# Patient Record
Sex: Female | Born: 1970 | Race: White | Hispanic: No | Marital: Married | Smoking: Former smoker
Health system: Southern US, Community
[De-identification: ages and names within clinical notes are randomized; demographics above are authoritative.]

## PROBLEM LIST (undated history)

## (undated) DIAGNOSIS — G4700 Insomnia, unspecified: Secondary | ICD-10-CM

## (undated) DIAGNOSIS — J302 Other seasonal allergic rhinitis: Secondary | ICD-10-CM

## (undated) DIAGNOSIS — G44009 Cluster headache syndrome, unspecified, not intractable: Secondary | ICD-10-CM

## (undated) DIAGNOSIS — O139 Gestational [pregnancy-induced] hypertension without significant proteinuria, unspecified trimester: Secondary | ICD-10-CM

## (undated) DIAGNOSIS — G43909 Migraine, unspecified, not intractable, without status migrainosus: Secondary | ICD-10-CM

## (undated) HISTORY — DX: Gestational (pregnancy-induced) hypertension without significant proteinuria, unspecified trimester: O13.9

## (undated) HISTORY — DX: Migraine, unspecified, not intractable, without status migrainosus: G43.909

## (undated) HISTORY — DX: Other seasonal allergic rhinitis: J30.2

## (undated) HISTORY — DX: Cluster headache syndrome, unspecified, not intractable: G44.009

## (undated) HISTORY — PX: TONSILLECTOMY: SHX5217

## (undated) HISTORY — DX: Insomnia, unspecified: G47.00

---

## 2007-02-04 ENCOUNTER — Inpatient Hospital Stay (HOSPITAL_COMMUNITY): Admission: AD | Admit: 2007-02-04 | Discharge: 2007-02-04 | Payer: Self-pay | Admitting: Obstetrics and Gynecology

## 2007-02-07 ENCOUNTER — Inpatient Hospital Stay (HOSPITAL_COMMUNITY): Admission: AD | Admit: 2007-02-07 | Discharge: 2007-02-10 | Payer: Self-pay | Admitting: Obstetrics and Gynecology

## 2008-01-10 ENCOUNTER — Encounter: Payer: Self-pay | Admitting: Family Medicine

## 2008-01-21 ENCOUNTER — Ambulatory Visit: Payer: Self-pay | Admitting: Family Medicine

## 2008-01-21 DIAGNOSIS — M109 Gout, unspecified: Secondary | ICD-10-CM

## 2008-01-26 ENCOUNTER — Ambulatory Visit: Payer: Self-pay | Admitting: Family Medicine

## 2008-01-27 ENCOUNTER — Telehealth: Payer: Self-pay | Admitting: Family Medicine

## 2008-01-28 ENCOUNTER — Encounter: Payer: Self-pay | Admitting: Family Medicine

## 2008-03-13 ENCOUNTER — Ambulatory Visit: Payer: Self-pay | Admitting: Family Medicine

## 2008-03-13 DIAGNOSIS — J309 Allergic rhinitis, unspecified: Secondary | ICD-10-CM | POA: Insufficient documentation

## 2008-05-10 ENCOUNTER — Telehealth: Payer: Self-pay | Admitting: Family Medicine

## 2008-05-12 ENCOUNTER — Ambulatory Visit: Payer: Self-pay | Admitting: Family Medicine

## 2008-05-12 DIAGNOSIS — G4700 Insomnia, unspecified: Secondary | ICD-10-CM

## 2008-06-05 ENCOUNTER — Ambulatory Visit: Payer: Self-pay | Admitting: Family Medicine

## 2008-06-28 ENCOUNTER — Telehealth: Payer: Self-pay | Admitting: *Deleted

## 2008-12-11 ENCOUNTER — Telehealth: Payer: Self-pay | Admitting: Family Medicine

## 2009-01-04 ENCOUNTER — Ambulatory Visit: Payer: Self-pay | Admitting: Family Medicine

## 2009-01-04 DIAGNOSIS — G44009 Cluster headache syndrome, unspecified, not intractable: Secondary | ICD-10-CM | POA: Insufficient documentation

## 2009-01-11 ENCOUNTER — Ambulatory Visit: Payer: Self-pay | Admitting: Family Medicine

## 2009-03-02 ENCOUNTER — Ambulatory Visit: Payer: Self-pay | Admitting: Internal Medicine

## 2009-03-02 DIAGNOSIS — J069 Acute upper respiratory infection, unspecified: Secondary | ICD-10-CM | POA: Insufficient documentation

## 2009-05-30 ENCOUNTER — Telehealth: Payer: Self-pay | Admitting: Family Medicine

## 2009-05-31 ENCOUNTER — Ambulatory Visit: Payer: Self-pay | Admitting: Family Medicine

## 2009-05-31 LAB — CONVERTED CEMR LAB: HDL goal, serum: 40 mg/dL

## 2009-07-11 ENCOUNTER — Telehealth: Payer: Self-pay | Admitting: Family Medicine

## 2009-09-04 ENCOUNTER — Ambulatory Visit: Payer: Self-pay | Admitting: Family Medicine

## 2009-09-04 DIAGNOSIS — J157 Pneumonia due to Mycoplasma pneumoniae: Secondary | ICD-10-CM

## 2010-02-12 NOTE — Progress Notes (Signed)
Summary: sinus infection  Phone Note Call from Patient   Caller: Patient Call For: Roderick Pee MD Summary of Call: Pt. is calling to ask for a Zpack for sinus infection, and cannot come to the office.   [facility] [phone]   [phone] Initial call taken by: Lynann Beaver CMA,  May 30, 2009 9:41 AM  Follow-up for Phone Call        problem+========OV .tomorrow........today she can take plain Claritin, 10 mg b.i.d., afrin nasal spray, followed by saline nasal spray with a netti pot twice daily until she sees me tomorrow Follow-up by: Roderick Pee MD,  May 30, 2009 10:10 AM  Additional Follow-up for Phone Call Additional follow up Details #1::        Pt. notified and appt scheduled. Additional Follow-up by: Lynann Beaver CMA,  May 30, 2009 10:13 AM

## 2010-02-12 NOTE — Assessment & Plan Note (Signed)
Summary: cough/chest congestion/stuffy nose/cjr   Vital Signs:  Patient profile:   40 year old female Weight:      170 pounds Temp:     98.4 degrees F oral BP sitting:   100 / 68  (right arm) Cuff size:   regular  Vitals Entered By: Duard Brady LPN (March 02, 2009 11:16 AM) CC: c.o chest congestion, sob and tightness with exercise, 2 weeks ago cough, congestion Is Patient Diabetic? No   CC:  c.o chest congestion, sob and tightness with exercise, 2 weeks ago cough, and congestion.  History of Present Illness: 40 year old patient who has a two week history of cough and congestion.  She is somewhat improved today, but still has exertional paroxysm of coughing, minimal sputum production, and dyspnea, and a sense of unable to take a deep breath.  She gets a bit short of breath with household activities.  She did run in a competitive race 5 days ago and became quite dyspneic with diminished exercise capacity.  At the present time.  She is still coughing, but cough is largely nonproductive.  There is been no fever or wheezing.  There is no history of asthma  Preventive Screening-Counseling & Management  Alcohol-Tobacco     Smoking Status: quit  Allergies: 1)  Prednisone  Family History: Family History Hypertension  Social History: Smoking Status:  quit  Review of Systems       The patient complains of anorexia and prolonged cough.  The patient denies fever, weight loss, weight gain, vision loss, decreased hearing, hoarseness, chest pain, syncope, dyspnea on exertion, peripheral edema, headaches, hemoptysis, abdominal pain, melena, hematochezia, severe indigestion/heartburn, hematuria, incontinence, genital sores, muscle weakness, suspicious skin lesions, transient blindness, difficulty walking, depression, unusual weight change, abnormal bleeding, enlarged lymph nodes, angioedema, and breast masses.    Physical Exam  General:  Well-developed,well-nourished,in no acute  distress; alert,appropriate and cooperative throughout examination Head:  Normocephalic and atraumatic without obvious abnormalities. No apparent alopecia or balding. Eyes:  No corneal or conjunctival inflammation noted. EOMI. Perrla. Funduscopic exam benign, without hemorrhages, exudates or papilledema. Vision grossly normal. Ears:  External ear exam shows no significant lesions or deformities.  Otoscopic examination reveals clear canals, tympanic membranes are intact bilaterally without bulging, retraction, inflammation or discharge. Hearing is grossly normal bilaterally. Mouth:  Oral mucosa and oropharynx without lesions or exudates.  Teeth in good repair. Neck:  No deformities, masses, or tenderness noted. Lungs:  Normal respiratory effort, chest expands symmetrically. Lungs are clear to auscultation, no crackles or wheezes. O2 saturation at rest 98% O2 saturation after a brisk walk down the hall 99% Heart:  Normal rate and regular rhythm. S1 and S2 normal without gallop, murmur, click, rub or other extra sounds. Abdomen:  Bowel sounds positive,abdomen soft and non-tender without masses, organomegaly or hernias noted.   Impression & Recommendations:  Problem # 1:  URI (ICD-465.9)  Her updated medication list for this problem includes:    Zyrtec Allergy 10 Mg Tabs (Cetirizine hcl) ..... Once daily    Promethazine Hcl 25 Mg Supp (Promethazine hcl) .Marland Kitchen..Marland Kitchen 1 rectally q 8hr. as needed    Hydrocodone-homatropine 5-1.5 Mg/78ml Syrp (Hydrocodone-homatropine) .Marland Kitchen... 1 teaspoon every 6 hours as needed for cough  Her updated medication list for this problem includes:    Zyrtec Allergy 10 Mg Tabs (Cetirizine hcl) ..... Once daily    Promethazine Hcl 25 Mg Supp (Promethazine hcl) .Marland Kitchen..Marland Kitchen 1 rectally q 8hr. as needed    Hydrocodone-homatropine 5-1.5 Mg/42ml Syrp (Hydrocodone-homatropine) .Marland KitchenMarland KitchenMarland KitchenMarland Kitchen 1  teaspoon every 6 hours as needed for cough  Complete Medication List: 1)  Daily Multiple Vitamins Tabs  (Multiple vitamin) .... Take one tab once daily 2)  Zyrtec Allergy 10 Mg Tabs (Cetirizine hcl) .... Once daily 3)  Celexa 40 Mg Tabs (Citalopram hydrobromide) .... Take 1 1/2 tab by mouth at bedtime. 4)  Imitrex 25 Mg Tabs (Sumatriptan succinate) .... Take one on onset of migraine 5)  Promethazine Hcl 25 Mg Supp (Promethazine hcl) .Marland Kitchen.. 1 rectally q 8hr. as needed 6)  Vicodin Es 7.5-750 Mg Tabs (Hydrocodone-acetaminophen) .... Take 1 tablet by mouth four times a day as needed pain 7)  Topamax 100 Mg Tabs (Topiramate) .Marland Kitchen.. 1 tab @ bedtime 8)  Prednisone 20 Mg Tabs (Prednisone) .... One twice daily 9)  Hydrocodone-homatropine 5-1.5 Mg/34ml Syrp (Hydrocodone-homatropine) .Marland Kitchen.. 1 teaspoon every 6 hours as needed for cough  Patient Instructions: 1)  Get plenty of rest, drink lots of clear liquids, and use Tylenol or Ibuprofen for fever and comfort. Return in 7-10 days if you're not better:sooner if you're feeling worse. Prescriptions: HYDROCODONE-HOMATROPINE 5-1.5 MG/5ML SYRP (HYDROCODONE-HOMATROPINE) 1 teaspoon every 6 hours as needed for cough  #6 oz x 0   Entered and Authorized by:   Gordy Savers  MD   Signed by:   Gordy Savers  MD on 03/02/2009   Method used:   Print then Give to Patient   RxID:   [id number] PREDNISONE 20 MG TABS (PREDNISONE) one twice daily  #14 x 0   Entered and Authorized by:   Gordy Savers  MD   Signed by:   Gordy Savers  MD on 03/02/2009   Method used:   Print then Give to Patient   RxID:   [id number]

## 2010-02-12 NOTE — Assessment & Plan Note (Signed)
Summary: virus-fever x 4 days//ccm   Vital Signs:  Patient profile:   41 year old female Height:      68 inches Weight:      172 pounds BMI:     26.25 Temp:     99.0 degrees F oral BP sitting:   110 / 80  (left arm) Cuff size:   regular  Vitals Entered By: Kern Reap CMA (AAMA) (September 04, 2009 10:00 AM) CC: chest cough, fever, body aches   CC:  chest cough, fever, and body aches.  History of Present Illness: Brenda Haynes is a 40 year old, married female, nonsmoker, who comes in today with a 5 day history of fever, chills, sore throat, cough, headache, and diffuse myalgias.  She states she was around her niece two weeks ago, who had a Mycoplasma, and 5 days ago, she began running fever 104, and coughing.  The fever lasted for 4 days and went away.  The cough has gotten worse.  She has no sputum production.  Allergies: 1)  Prednisone  Review of Systems      See HPI  Physical Exam  General:  Well-developed,well-nourished,in no acute distress; alert,appropriate and cooperative throughout examination Head:  Normocephalic and atraumatic without obvious abnormalities. No apparent alopecia or balding. Eyes:  No corneal or conjunctival inflammation noted. EOMI. Perrla. Funduscopic exam benign, without hemorrhages, exudates or papilledema. Vision grossly normal. Ears:  External ear exam shows no significant lesions or deformities.  Otoscopic examination reveals clear canals, tympanic membranes are intact bilaterally without bulging, retraction, inflammation or discharge. Hearing is grossly normal bilaterally. Nose:  External nasal examination shows no deformity or inflammation. Nasal mucosa are pink and moist without lesions or exudates. Mouth:  Oral mucosa and oropharynx without lesions or exudates.  Teeth in good repair. Neck:  No deformities, masses, or tenderness noted. Chest Wall:  No deformities, masses, or tenderness noted. 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 Take 1 tablet by mouth two times a day  Orders: Prescription Created Electronically [phone])  Complete Medication List: 1)  Daily Multiple Vitamins Tabs (Multiple vitamin) .... Take one tab once daily 2)  Zyrtec Allergy 10 Mg Tabs (Cetirizine hcl) .... Once daily 3)  Celexa 40 Mg Tabs (Citalopram hydrobromide) .... Take 1 1/2 tab by mouth at bedtime. 4)  Imitrex 25 Mg Tabs (Sumatriptan succinate) .... Take one on onset of migraine 5)  Promethazine Hcl 25 Mg Supp (Promethazine hcl) .Marland Kitchen.. 1 rectally q 8hr. as needed 6)  Vicodin Es 7.5-750 Mg Tabs (Hydrocodone-acetaminophen) .... Take 1 tablet by mouth four times a day as needed pain 7)  Topamax 100 Mg Tabs (Topiramate) .Marland Kitchen.. 1 tab @ bedtime 8)  Flonase 50 Mcg/act Susp (Fluticasone propionate) .... Uad 9)  Hydromet 5-1.5 Mg/11ml Syrp (Hydrocodone-homatropine) .Marland Kitchen.. 1 or 2 tsps at bedtime as needed cough 10)  Lorazepam 0.5 Mg Tabs (Lorazepam) .... One two times a day as needed anxiety 11)  Doxycycline Hyclate 100 Mg Caps (Doxycycline hyclate) .... Take 1 tablet by mouth two times a day  Patient Instructions: 1)  drink 30 ounces of water daily 2)  Hydromet one to 2 teaspoons 3 times a day p.r.n. for cough 3)  doxycycline 100 mg b.i.d. x 2 weeks.  Return p.r.n. 4)  also schedule a 30 minute appointment sometime this fall  for general medical exam Prescriptions: HYDROMET 5-1.5 MG/5ML SYRP (HYDROCODONE-HOMATROPINE) 1 or 2 tsps at bedtime as needed cough  #4oz x 1   Entered and Authorized by:   Roderick Pee MD   Signed by:   Roderick Pee MD on 09/04/2009   Method used:   Print then Give to Patient   RxID:   [id number] DOXYCYCLINE HYCLATE 100 MG CAPS (DOXYCYCLINE HYCLATE) Take 1 tablet by mouth two times a day  #30 x 1   Entered and  Authorized by:   Roderick Pee MD   Signed by:   Roderick Pee MD on 09/04/2009   Method used:   Electronically to        [facility] [address] [phone]* (retail)       [address]       [place], [place]  [address]       Ph: [phone] or [phone]       Fax: [phone]   RxID:   [id number]

## 2010-02-12 NOTE — Assessment & Plan Note (Signed)
Summary: sinus infection/dm   Vital Signs:  Patient profile:   40 year old female Weight:      165 pounds Temp:     97.4 degrees F oral BP sitting:   122 / 90  (right arm)  Vitals Entered By: Kathrynn Speed CMA (May 31, 2009 2:31 PM) CC: Sinus problems 6 days, tired , sore throat, fever 101yesterday, Lipid Management   CC:  Sinus problems 6 days, tired , sore throat, fever 101yesterday, and Lipid Management.  History of Present Illness: Brenda Haynes is a 40 year old female, married, nonsmoker, who comes in today for evaluation of head congestion, postnasal drip, and nonproductive cough.  She has a history of allergic rhinitis, for which he takes Zyrtec 10 mg daily.  Also, this week, she developed a fever of 101, and fatigue, and aching all over.  Review of systems negative.  She is intolerant of steroids.  It causes her to feel psychotic  Lipid Management History:      Negative NCEP/ATP III risk factors include female age less than 56 years old and non-tobacco-user status.    Preventive Screening-Counseling & Management  Alcohol-Tobacco     Smoking Status: quit  Caffeine-Diet-Exercise     Does Patient Exercise: yes  Current Medications (verified): 1)  Daily Multiple Vitamins  Tabs (Multiple Vitamin) .... Take One Tab Once Daily 2)  Zyrtec Allergy 10 Mg Tabs (Cetirizine Hcl) .... Once Daily 3)  Celexa 40 Mg Tabs (Citalopram Hydrobromide) .... Take 1 1/2 Tab By Mouth At Bedtime. 4)  Imitrex 25 Mg Tabs (Sumatriptan Succinate) .... Take One On Onset of Migraine 5)  Promethazine Hcl 25 Mg Supp (Promethazine Hcl) .Marland Kitchen.. 1 Rectally Q 8hr. As Needed 6)  Vicodin Es 7.5-750 Mg Tabs (Hydrocodone-Acetaminophen) .... Take 1 Tablet By Mouth Four Times A Day As Needed Pain 7)  Topamax 100 Mg Tabs (Topiramate) .Marland Kitchen.. 1 Tab @ Bedtime  Allergies (verified): 1)  Prednisone  Past History:  Past medical, surgical, family and social histories (including risk factors) reviewed for relevance to  current acute and chronic problems.  Past Medical History: Reviewed history from 01/04/2009 and no changes required. childbirth x 2 pseudogout migraine with aura  Past Surgical History: Reviewed history from 01/21/2008 and no changes required. Tonsillectomy  Family History: Reviewed history from 03/02/2009 and no changes required. Family History Hypertension  Social History: Reviewed history from 06/05/2008 and no changes required. 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 No apparent alopecia or balding. Eyes:  No corneal or conjunctival inflammation noted. EOMI. Perrla. Funduscopic exam benign, without hemorrhages, exudates or papilledema. Vision grossly normal. Ears:  External ear exam shows no significant lesions or deformities.  Otoscopic examination reveals clear canals, tympanic membranes are intact bilaterally without bulging, retraction, inflammation or discharge. Hearing is grossly normal bilaterally. Nose:  External nasal examination shows no deformity or inflammation. Nasal mucosa are pink and moist without lesions or exudates. Mouth:  Oral mucosa and oropharynx without lesions or exudates.  Teeth in good repair. Neck:  No deformities, masses, or tenderness noted. Lungs:  Normal respiratory effort, chest expands symmetrically. Lungs are clear to auscultation, no crackles or wheezes.   Impression & Recommendations:  Problem # 1:  URI (ICD-465.9) Assessment Deteriorated  The following  medications were removed from the medication list:    Hydrocodone-homatropine 5-1.5 Mg/54ml Syrp (Hydrocodone-homatropine) .Marland Kitchen... 1 teaspoon every 6 hours as  needed for cough Her updated medication list for this problem includes:    Zyrtec Allergy 10 Mg Tabs (Cetirizine hcl) ..... Once daily    Promethazine Hcl 25 Mg Supp (Promethazine hcl) .Marland Kitchen..Marland Kitchen 1 rectally q 8hr. as needed    Hydromet 5-1.5 Mg/9ml Syrp (Hydrocodone-homatropine) .Marland Kitchen... 1 or 2 tsps at bedtime as needed cough  Orders: Prescription Created Electronically [phone])  Problem # 2:  ALLERGIC RHINITIS (ICD-477.9) Assessment: Deteriorated  Her updated medication list for this problem includes:    Zyrtec Allergy 10 Mg Tabs (Cetirizine hcl) ..... Once daily    Promethazine Hcl 25 Mg Supp (Promethazine hcl) .Marland Kitchen..Marland Kitchen 1 rectally q 8hr. as needed    Flonase 50 Mcg/act Susp (Fluticasone propionate) ..... Uad  Orders: Prescription Created Electronically [phone])  Complete Medication List: 1)  Daily Multiple Vitamins Tabs (Multiple vitamin) .... Take one tab once daily 2)  Zyrtec Allergy 10 Mg Tabs (Cetirizine hcl) .... Once daily 3)  Celexa 40 Mg Tabs (Citalopram hydrobromide) .... Take 1 1/2 tab by mouth at bedtime. 4)  Imitrex 25 Mg Tabs (Sumatriptan succinate) .... Take one on onset of migraine 5)  Promethazine Hcl 25 Mg Supp (Promethazine hcl) .Marland Kitchen.. 1 rectally q 8hr. as needed 6)  Vicodin Es 7.5-750 Mg Tabs (Hydrocodone-acetaminophen) .... Take 1 tablet by mouth four times a day as needed pain 7)  Topamax 100 Mg Tabs (Topiramate) .Marland Kitchen.. 1 tab @ bedtime 8)  Flonase 50 Mcg/act Susp (Fluticasone propionate) .... Uad 9)  Hydromet 5-1.5 Mg/56ml Syrp (Hydrocodone-homatropine) .Marland Kitchen.. 1 or 2 tsps at bedtime as needed cough  Lipid Assessment/Plan:      Based on NCEP/ATP III, the patient's risk factor category is "0-1 risk factors".  The patient's lipid goals are as follows: Total cholesterol goal is 200; LDL cholesterol goal is 160; HDL cholesterol goal is 40; Triglyceride goal is 150.     Patient Instructions: 1)  drink 30 ounces of water daily, take 10 mg of plain Claritin in the morning and 10 mg of  plain Zyrtec at bedtime.  Also used.  The nasal irrigation program with afrin, warm salt water, and Flonase nightly.  After 5 nights remember to stop the afrin  Prescriptions: CELEXA 40 MG TABS (CITALOPRAM HYDROBROMIDE) Take 1 1/2 tab by mouth at bedtime.  #150 x 3   Entered and Authorized by:   Roderick Pee MD   Signed by:   Roderick Pee MD on 05/31/2009   Method used:   Electronically to        [facility]. [address] [phone]* (retail)       [address]       [place], [place]  [address]       Ph: [phone] or [phone]       Fax: [phone]   RxID:   [id number] HYDROMET 5-1.5 MG/5ML SYRP (HYDROCODONE-HOMATROPINE) 1 or 2 tsps at bedtime as needed cough  #4oz x 1   Entered and Authorized by:   Roderick Pee MD   Signed by:   Roderick Pee MD on 05/31/2009   Method used:   Print then Give to Patient   RxID:   [id number] FLONASE 50 MCG/ACT SUSP (FLUTICASONE PROPIONATE) UAD  #1 x 3   Entered and Authorized by:   Roderick Pee MD   Signed  by:   Roderick Pee MD on 05/31/2009   Method used:   Electronically to        [facility]. [address] [phone]* (retail)       [address]       [place], [place]  [address]       Ph: [phone] or [phone]       Fax: [phone]   RxID:   [id number]

## 2010-02-12 NOTE — Progress Notes (Signed)
Summary: phone not avail 6-29  Phone Note Call from Patient   Caller: Patient Call For: Roderick Pee MD Summary of Call: Pt is having anxiety issues, and asking for RX.  Her daughter has a fractured arm, and husband has lost his job. [phone] [facility] Initial call taken by: Lynann Beaver CMA,  July 11, 2009 11:25 AM  Follow-up for Phone Call        lorazepam 0.5  number 60; one twice daily as needed for anxiety Follow-up by: Gordy Savers  MD,  July 11, 2009 12:44 PM  Additional Follow-up for Phone Call Additional follow up Details #1::        Patient's phone says not available.  Rudy Jew, RN  July 11, 2009 1:40 PM Same message. Raelene Bott Spell, RN  July 11, 2009 3:30 PM      New/Updated Medications: LORAZEPAM 0.5 MG TABS (LORAZEPAM) One two times a day as needed anxiety Prescriptions: LORAZEPAM 0.5 MG TABS (LORAZEPAM) One two times a day as needed anxiety  #60 x 0   Entered by:   Rudy Jew, RN   Authorized by:   Gordy Savers  MD   Signed by:   Rudy Jew, RN on 07/11/2009   Method used:   Telephoned to ...       [facility] [address] [phone]* (retail)       [address]       [place], [place]  [address]       Ph: [phone] or [phone]       Fax: [phone]   RxID:   [id number]  Pt notified.

## 2010-07-07 ENCOUNTER — Other Ambulatory Visit: Payer: Self-pay | Admitting: Family Medicine

## 2010-10-04 LAB — URIC ACID: Uric Acid, Serum: 6.2

## 2010-10-04 LAB — COMPREHENSIVE METABOLIC PANEL
Albumin: 3 — ABNORMAL LOW
Alkaline Phosphatase: 111
BUN: 10
CO2: 23
Chloride: 102
Creatinine, Ser: 0.91
GFR calc non Af Amer: >60
Glucose, Bld: 119 — ABNORMAL HIGH
Potassium: 3.8
Total Bilirubin: 0.6

## 2010-10-04 LAB — CBC
HCT: 39.3
Hemoglobin: 11.3 — ABNORMAL LOW
Hemoglobin: 13.3
MCHC: 34.7
Platelets: 206
RBC: 3.89
RBC: 4.68
RDW: 14.3
RDW: 14.4
WBC: 12.8 — ABNORMAL HIGH
WBC: 12.8 — ABNORMAL HIGH

## 2010-10-04 LAB — RPR: RPR Ser Ql: NONREACTIVE

## 2011-01-02 ENCOUNTER — Other Ambulatory Visit: Payer: Self-pay | Admitting: Family Medicine

## 2011-02-06 ENCOUNTER — Other Ambulatory Visit: Payer: Self-pay | Admitting: Family Medicine

## 2011-03-18 ENCOUNTER — Other Ambulatory Visit: Payer: Self-pay | Admitting: Family Medicine

## 2011-03-18 NOTE — Telephone Encounter (Signed)
ok 

## 2011-03-18 NOTE — Telephone Encounter (Signed)
Pt citalopram 40mg  #90 with 3 refills [facility]- [phone]. Pt would like 2 wk supply [facility] [address] [phone]

## 2011-03-19 MED ORDER — CITALOPRAM HYDROBROMIDE 40 MG PO TABS: 40.0000 mg | ORAL_TABLET | Freq: Every day | ORAL | Status: DC

## 2011-03-19 NOTE — Telephone Encounter (Signed)
2 week supply called in.

## 2011-05-20 ENCOUNTER — Telehealth: Payer: Self-pay | Admitting: Family Medicine

## 2011-05-20 NOTE — Telephone Encounter (Signed)
Pt needs new script sent in to pharmacy for citalopram (CELEXA) 40 MG tablet  BID TAKE 1 AND 1/2 TABLETS BY MOUTH EVERY NIGHT AT BEDTIME - last time rx was called in directions were take one daily pt requesting to have this corrected   [facility]

## 2011-05-20 NOTE — Telephone Encounter (Signed)
She's not been here in over a year and a half,,,,,,,,,, see if she can come in on Friday morning at 9:30 or offer to give her a months worth of medication until she can make an appointment

## 2011-05-20 NOTE — Telephone Encounter (Signed)
Patient's last office visit 08/2009 is this okay to fill?

## 2011-05-20 NOTE — Telephone Encounter (Signed)
Left message on machine for patient to return our call and schedule an appointment

## 2011-05-28 ENCOUNTER — Ambulatory Visit (INDEPENDENT_AMBULATORY_CARE_PROVIDER_SITE_OTHER): Payer: BC Managed Care – PPO | Admitting: Family Medicine

## 2011-05-28 ENCOUNTER — Encounter: Payer: Self-pay | Admitting: Family Medicine

## 2011-05-28 VITALS — BP 108/70 | HR 76 | Ht 69.0 in | Wt 189.0 lb

## 2011-05-28 DIAGNOSIS — G4700 Insomnia, unspecified: Secondary | ICD-10-CM

## 2011-05-28 DIAGNOSIS — J309 Allergic rhinitis, unspecified: Secondary | ICD-10-CM

## 2011-05-28 DIAGNOSIS — J019 Acute sinusitis, unspecified: Secondary | ICD-10-CM

## 2011-05-28 DIAGNOSIS — G43909 Migraine, unspecified, not intractable, without status migrainosus: Secondary | ICD-10-CM

## 2011-05-28 MED ORDER — TOPIRAMATE 100 MG PO TABS: 100.0000 mg | ORAL_TABLET | Freq: Every day | ORAL | Status: DC

## 2011-05-28 MED ORDER — AMOXICILLIN 500 MG PO CAPS: 1000.0000 mg | ORAL_CAPSULE | Freq: Two times a day (BID) | ORAL | Status: AC

## 2011-05-28 MED ORDER — CITALOPRAM HYDROBROMIDE 40 MG PO TABS: 60.0000 mg | ORAL_TABLET | Freq: Every day | ORAL | Status: DC

## 2011-05-28 NOTE — Patient Instructions (Signed)
  Avoid taking 2 antihistamines.  If allergies aren't well controlled with one, then add decongestant, or sinus rinses or start nasal steroid sprays (call if you need prescription of Flonase).  Also can use mucinex if mucus is thick.  Start antibiotics if symptoms aren't improving in 1-2 days after starting sinus rinses and decongestants.  Take the entire course of medication.  If your symptoms are worsening, rather than improving, call after 5-7 days to have antibiotic changed.

## 2011-05-28 NOTE — Progress Notes (Signed)
Chief Complaint  Patient presents with  . Establish Care    and discuss seasonal allergies vs. sinus infection. Needs refills on Topamax and Celexa.   HPI:  1. Allergies--has been taking claritin with okay results.  In the past she has been told to take Zyrtec when worse, and both zyrtec AND claritin if needed when really bad.  Has taken nasal steroids in the past with good results, but only temporarily helped.  Often turns quickly into sinus infections.  Nasal mucus is yellow-green x 5 days.  Felt like she got sick with a cold about 10 days ago--stayed in bed that day.  Only using claritin.  Not having sinus headaches or fevers.  2.  Headaches--both migraine and cluster headaches.  Has been on topamax x 2 years, and rarely gets any headaches. Needs refill on med.  3.  Insomnia--treated with 60 mg of celexa.  Pt reports that she didn't really recognize that there was any underlying depression until she realized how much better she felt after being on celexa.  Didn't get benefit from insomnia until dose was gradually increased to 60mg .  Has been on this dose for about two years and denies side effects.  Denies depression/anxiety currently.   Past Medical History  Diagnosis Date  . Cluster headaches   . Migraine headache   . Seasonal allergies   . Insomnia     thinks there was component of depression--insomnia well treated with celexa  . Pregnancy induced hypertension     with both pregnancy    Past Surgical History  Procedure Date  . Tonsillectomy child    History   Social History  . Marital Status: Married    Spouse Name: N/A    Number of Children: 2  . Years of Education: N/A   Occupational History  . 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 Smoking status: Former Smoker    Quit date: 01/14/2003  . Smokeless tobacco: Never Used  . Alcohol Use: Yes     4 drinks per week.  . Drug Use: No  . Sexually Active: Yes --  Female partner(s)    Birth Control/ Protection: IUD   Other Topics Concern  . Not on file   Social History Narrative   Married, 1 son, 1 daughter. 1 dog    Family History  Problem Relation Age of Onset  . Hypertension Mother   . Cancer Mother 4    breast cancer  . Hypertension Father   . Hypertension Sister   . Cancer Paternal Aunt   . Cancer Maternal Grandfather   . Hypertension Sister    Current Outpatient Prescriptions on File Prior to Visit  Medication Sig Dispense Refill  . cetirizine (ZYRTEC) 10 MG tablet Take 10 mg by mouth daily.      Marland Kitchen levonorgestrel (MIRENA) 20 MCG/24HR IUD 1 each by Intrauterine route once.      . loratadine (CLARITIN) 10 MG tablet Take 10 mg by mouth as needed.      Marland Kitchen DISCONTD: citalopram (CELEXA) 40 MG tablet Take 1 tablet (40 mg total) by mouth daily.  180 tablet  3  . DISCONTD: topiramate (TOPAMAX) 100 MG tablet TAKE 1 TABLET BY MOUTH AT BEDTIME  100 tablet  1    Allergies  Allergen Reactions  . Prednisone     REACTION: psychotic   ROS:  Denies fevers, cough, shortness of breath, chest pain, palpitations, nausea, vomiting.  Only occasional  heartburn.  Denies bowel changes.  Denies skin rashes, depression, anxiety. Insomnia is well controlled.  No recent headaches  PHYSICAL EXAM: BP 108/70  Pulse 76  Ht 5\' 9"  (1.753 m)  Wt 189 lb (85.73 kg)  BMI 27.91 kg/m2 Well developed, pleasant female in no distress HEENT:  PERRL, EOMI, conjunctiva clear.  TM's and eac's normal.  OP clear.  Nasal mucosa moderately edematous, trace erythema, clear mucus.  Sinuses nontender Neck: no lymphadenopathy, thyromegaly or mass Heart: regular rate and rhythm without murmur Lungs: clear bilaterally Abdomen: soft, nontender Skin: no rash Psych: normal mood, affect, hygiene and grooming Neuro: alert and oriented, cranial nerves grossly intact, normal gait  ASSESSMENT/PLAN: 1. Migraine headache  topiramate (TOPAMAX) 100 MG tablet  2. Insomnia  citalopram  (CELEXA) 40 MG tablet  3. Acute sinusitis  amoxicillin (AMOXIL) 500 MG capsule  4. ALLERGIC RHINITIS     Sinus infection vs URI, plus seasonal allergies-- Avoid taking 2 antihistamines.  If allergies aren't well controlled with one, then add decongestant, or sinus rinses or start nasal steroid sprays.  Experiment to see which antihistamine is most effective. Also can use mucinex if mucus is thick.  Headaches and insomnia are well controlled with current regimen.  We discussed that this is an unusually high dose of celexa, but since it is effective and not having side effects, okay to continue.  Consider trying to cut back to 40mg  at some point in future.  Didn't rx z-pak due to interaction with celexa.  Return for CPE, fasting for labs

## 2011-09-08 ENCOUNTER — Encounter: Payer: BC Managed Care – PPO | Admitting: Family Medicine

## 2011-10-16 ENCOUNTER — Encounter: Payer: Self-pay | Admitting: Internal Medicine

## 2011-10-27 ENCOUNTER — Encounter: Payer: BC Managed Care – PPO | Admitting: Family Medicine

## 2011-11-06 ENCOUNTER — Encounter: Payer: Self-pay | Admitting: Family Medicine

## 2011-11-06 ENCOUNTER — Ambulatory Visit (INDEPENDENT_AMBULATORY_CARE_PROVIDER_SITE_OTHER): Payer: BC Managed Care – PPO | Admitting: Family Medicine

## 2011-11-06 VITALS — BP 110/82 | HR 72 | Temp 98.1°F | Ht 69.0 in | Wt 197.0 lb

## 2011-11-06 DIAGNOSIS — M545 Low back pain, unspecified: Secondary | ICD-10-CM

## 2011-11-06 DIAGNOSIS — R8290 Unspecified abnormal findings in urine: Secondary | ICD-10-CM

## 2011-11-06 DIAGNOSIS — R82998 Other abnormal findings in urine: Secondary | ICD-10-CM

## 2011-11-06 LAB — POCT URINALYSIS DIPSTICK
Glucose, UA: NEGATIVE
Ketones, UA: NEGATIVE
Protein, UA: NEGATIVE
Spec Grav, UA: 1.01
Urobilinogen, UA: NEGATIVE

## 2011-11-06 MED ORDER — NAPROXEN 500 MG PO TABS: 500.0000 mg | ORAL_TABLET | Freq: Two times a day (BID) | ORAL | Status: DC

## 2011-11-06 MED ORDER — CYCLOBENZAPRINE HCL 10 MG PO TABS: 5.0000 mg | ORAL_TABLET | Freq: Three times a day (TID) | ORAL | Status: DC | PRN

## 2011-11-06 NOTE — Patient Instructions (Addendum)
Continue with heat, massage.  Stretch at least 3 times daily. Take naproxyn twice daily with food--cut back on dose if causing any stomach pain. Take flexeril just at bedtime, as it likely will make you very sleepy.  If it doesn't, you can take it more often, if needed.  Expect improvement in the next 7-10 days.  If ongoing back pain, may need physical therapy.

## 2011-11-06 NOTE — Progress Notes (Signed)
Chief Complaint  Patient presents with  . Back Pain    woke up 3 days ago with LBP.(Did UA-showed 2+ leuks.)   HPI:  3 days ago, during the day, developed gradual onset of low back pain, right sided.  H/o LBP x 3 months last year, and this feels similar.  It eventually resolved, never saught help due to lack of insurance then.  Denies any injury, fall, change in activity or other factor which could cause back pain.  Tried an OTC Capsaicin pad and heating pad, both of which help some.  Tylenol also helps.  Hurts with leaning/bending (when stands back up she has excruciating pain), getting out of chair.  Denies any radfiation of pain, numbness, weakness.  Past Medical History  Diagnosis Date  . Cluster headaches   . Migraine headache   . Seasonal allergies   . Insomnia     thinks there was component of depression--insomnia well treated with celexa  . Pregnancy induced hypertension     with both pregnancy   Past Surgical History  Procedure Date  . Tonsillectomy child   History   Social History  . Marital Status: Married    Spouse Name: N/A    Number of Children: 2  . Years of Education: N/A   Occupational History  . 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 Smoking status: Former Smoker    Quit date: 01/14/2003  . Smokeless tobacco: Never Used  . Alcohol Use: Yes     4 drinks per week.  . Drug Use: No  . Sexually Active: Yes -- Female partner(s)    Birth Control/ Protection: IUD   Other Topics Concern  . Not on file   Social History Narrative   Married, 1 son, 1 daughter. 1 dog   Current Outpatient Prescriptions on File Prior to Visit  Medication Sig Dispense Refill  . cetirizine (ZYRTEC) 10 MG tablet Take 10 mg by mouth daily.      . citalopram (CELEXA) 40 MG tablet Take 1.5 tablets (60 mg total) by mouth daily.  135 tablet  1  . levonorgestrel (MIRENA) 20 MCG/24HR IUD 1 each by Intrauterine route once.      .  topiramate (TOPAMAX) 100 MG tablet Take 1 tablet (100 mg total) by mouth daily.  90 tablet  1   Allergies  Allergen Reactions  . Prednisone     REACTION: psychotic   ROS:  Denies any fevers, dysuria, urgency, frequency, incontinence, fevers, chills, nausea, vomiting.  Denies skin rashes  PHYSICAL EXAM: BP 110/82  Pulse 72  Temp 98.1 F (36.7 C) (Oral)  Ht 5\' 9"  (1.753 m)  Wt 197 lb (89.359 kg)  BMI 29.09 kg/m2 Well developed, pleasant female, in mild distress with position changes  Spine nontender.  SI joints nontender.  Mild tenderness and spasm of R lumbar paraspinous muscles.  Some pain with pyriformis stretching.  Negative SLR, but had increased pain in back with SLR (no referred symptoms/pain). DTR's 2+, normal strength/sensation No CVA tenderness Urine dip: 2+ leuks, otherwise normal  ASSESSMENT/PLAN: 1. LBP (low back pain)  POCT Urinalysis Dipstick, naproxen (NAPROSYN) 500 MG tablet, cyclobenzaprine (FLEXERIL) 10 MG tablet  2. Abnormal urinalysis  Urine culture   LBP, with muscle spasm Naproxyn and flexeril, along with heat, massage, stretches. Risks/side effects of meds reviewed.  Abnormal urine dip, asymptomatic (other than back pain)--send for culture, but not treat presumptively.  Consider PT if back pain  persists, worsens.  Returns if any new symptoms, or neuro symptoms develop for re-evaluation

## 2011-11-09 LAB — URINE CULTURE: Colony Count: 75000

## 2011-11-25 ENCOUNTER — Telehealth: Payer: Self-pay | Admitting: Family Medicine

## 2011-11-25 NOTE — Telephone Encounter (Signed)
Per May visit, she was to return for CPE with fasting labs.  I see 2 canceled appts (can't tell what they were for), but no pending appt, never had labs or CPE (just an acute visit).  At this point, at least needs a med check, fasting, and can r/s CPE.  Okay to refill this time only, but please schedule appt

## 2011-11-26 ENCOUNTER — Other Ambulatory Visit: Payer: Self-pay | Admitting: *Deleted

## 2011-11-26 DIAGNOSIS — Z79899 Other long term (current) drug therapy: Secondary | ICD-10-CM

## 2011-11-26 DIAGNOSIS — G4700 Insomnia, unspecified: Secondary | ICD-10-CM

## 2011-11-26 DIAGNOSIS — R51 Headache: Secondary | ICD-10-CM

## 2011-11-26 MED ORDER — CITALOPRAM HYDROBROMIDE 40 MG PO TABS: 60.0000 mg | ORAL_TABLET | Freq: Every day | ORAL | Status: DC

## 2011-11-26 NOTE — Telephone Encounter (Signed)
c-met, lipids, tsh, cbc, vitamin D.  Dx v58.69 (if others needed, use insomnia, headaches)

## 2011-11-26 NOTE — Telephone Encounter (Signed)
Called patient and left message informing pt that I called in her celexa but I need her to call me back to at least get a fasting med check scheduled.

## 2011-11-26 NOTE — Telephone Encounter (Signed)
Patient called me back and I was able to get her a med check for 01/01/12 @ 1:30pm, that was the first and only available this year. CPE's are well out into February. She would like to have her labs done prior to the appt on 12/30/11, can you please let me know what labs she will need and I will place future orders? Thanks.

## 2011-12-30 ENCOUNTER — Other Ambulatory Visit: Payer: BC Managed Care – PPO

## 2011-12-30 DIAGNOSIS — G4700 Insomnia, unspecified: Secondary | ICD-10-CM

## 2011-12-30 DIAGNOSIS — Z79899 Other long term (current) drug therapy: Secondary | ICD-10-CM

## 2011-12-30 DIAGNOSIS — R51 Headache: Secondary | ICD-10-CM

## 2011-12-30 LAB — COMPREHENSIVE METABOLIC PANEL
Alkaline Phosphatase: 80 U/L (ref 39–117)
CO2: 24 mEq/L (ref 19–32)
Creat: 0.83 mg/dL (ref 0.50–1.10)
Glucose, Bld: 79 mg/dL (ref 70–99)
Sodium: 138 mEq/L (ref 135–145)
Total Bilirubin: 0.6 mg/dL (ref 0.3–1.2)

## 2011-12-30 LAB — CBC WITH DIFFERENTIAL/PLATELET
Basophils Absolute: 0 10*3/uL (ref 0.0–0.1)
Basophils Relative: 1 % (ref 0–1)
Eosinophils Absolute: 0.8 10*3/uL — ABNORMAL HIGH (ref 0.0–0.7)
Eosinophils Relative: 10 % — ABNORMAL HIGH (ref 0–5)
MCH: 27.7 pg (ref 26.0–34.0)
MCHC: 34.1 g/dL (ref 30.0–36.0)
MCV: 81.3 fL (ref 78.0–100.0)
Neutrophils Relative %: 56 % (ref 43–77)
Platelets: 263 10*3/uL (ref 150–400)
RDW: 14.7 % (ref 11.5–15.5)

## 2011-12-30 LAB — LIPID PANEL
Cholesterol: 192 mg/dL (ref 0–200)
LDL Cholesterol: 131 mg/dL — ABNORMAL HIGH (ref 0–99)
Total CHOL/HDL Ratio: 4.7 Ratio
Triglycerides: 101 mg/dL (ref <150)
VLDL: 20 mg/dL (ref 0–40)

## 2012-01-01 ENCOUNTER — Encounter: Payer: Self-pay | Admitting: Family Medicine

## 2012-01-01 ENCOUNTER — Ambulatory Visit (INDEPENDENT_AMBULATORY_CARE_PROVIDER_SITE_OTHER): Payer: BC Managed Care – PPO | Admitting: Family Medicine

## 2012-01-01 VITALS — BP 110/62 | HR 72 | Ht 69.0 in | Wt 202.0 lb

## 2012-01-01 DIAGNOSIS — G44009 Cluster headache syndrome, unspecified, not intractable: Secondary | ICD-10-CM

## 2012-01-01 DIAGNOSIS — G4700 Insomnia, unspecified: Secondary | ICD-10-CM

## 2012-01-01 DIAGNOSIS — F3289 Other specified depressive episodes: Secondary | ICD-10-CM

## 2012-01-01 DIAGNOSIS — F329 Major depressive disorder, single episode, unspecified: Secondary | ICD-10-CM

## 2012-01-01 DIAGNOSIS — F411 Generalized anxiety disorder: Secondary | ICD-10-CM

## 2012-01-01 DIAGNOSIS — Z23 Encounter for immunization: Secondary | ICD-10-CM

## 2012-01-01 DIAGNOSIS — F325 Major depressive disorder, single episode, in full remission: Secondary | ICD-10-CM | POA: Insufficient documentation

## 2012-01-01 DIAGNOSIS — G43909 Migraine, unspecified, not intractable, without status migrainosus: Secondary | ICD-10-CM

## 2012-01-01 MED ORDER — TOPIRAMATE 100 MG PO TABS: 100.0000 mg | ORAL_TABLET | Freq: Every day | ORAL | Status: DC

## 2012-01-01 MED ORDER — CITALOPRAM HYDROBROMIDE 40 MG PO TABS: 60.0000 mg | ORAL_TABLET | Freq: Every day | ORAL | Status: DC

## 2012-01-01 NOTE — Progress Notes (Signed)
Chief Complaint  Patient presents with  . Med check    labs done.   HPI: Patient presents for a 6 month med check.  She never scheduled CPE (had to cancel her CPE due to a sick child).  She knows that she hasn't had a tetanus shot in over 10 years.  She had fasting labs done prior to appointment.  Headaches--not having any. Compliant with topamax and denies side effects.  Anxiety/Depression/Insomnia:  Doing very well on citalopram.  She denies any side effects.  Sleeping well. Denies tremor.    Recently seen with back pain.  Flexeril and naprosyn helped, improved, but not completely resolved.  Mild ache, no radiation of pain.  Doesn't have time for PT right now.  Past Medical History  Diagnosis Date  . Cluster headaches   . Migraine headache   . Seasonal allergies   . Insomnia     thinks there was component of depression--insomnia well treated with celexa  . Pregnancy induced hypertension     with both pregnancy   Past Surgical History  Procedure Date  . Tonsillectomy child   History   Social History  . Marital Status: Married    Spouse Name: N/A    Number of Children: 2  . Years of Education: N/A   Occupational History  . 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 Smoking status: Former Smoker    Quit date: 01/14/2003  . Smokeless tobacco: Never Used  . Alcohol Use: Yes     Comment: 4 drinks per week.  . Drug Use: No  . Sexually Active: Yes -- Female partner(s)    Birth Control/ Protection: IUD   Other Topics Concern  . Not on file   Social History Narrative   Married, 1 son, 1 daughter. 1 dog   Current outpatient prescriptions:cetirizine (ZYRTEC) 10 MG tablet, Take 10 mg by mouth daily., Disp: , Rfl: ;  citalopram (CELEXA) 40 MG tablet, Take 1.5 tablets (60 mg total) by mouth daily., Disp: 135 tablet, Rfl: 1;  levonorgestrel (MIRENA) 20 MCG/24HR IUD, 1 each by Intrauterine route once., Disp: , Rfl: ;   topiramate (TOPAMAX) 100 MG tablet, Take 1 tablet (100 mg total) by mouth daily., Disp: 90 tablet, Rfl: 1 cyclobenzaprine (FLEXERIL) 10 MG tablet, Take 0.5-1 tablets (5-10 mg total) by mouth 3 (three) times daily as needed for muscle spasms., Disp: 15 tablet, Rfl: 0;  naproxen (NAPROSYN) 500 MG tablet, Take 1 tablet (500 mg total) by mouth 2 (two) times daily with a meal., Disp: 30 tablet, Rfl: 0 (not taking flexeril or naproxen)  Allergies  Allergen Reactions  . Prednisone     REACTION: psychotic   ROS: Denies nausea, vomiting, diarrhea.  +weight gain due to lack of exercise and poor diet. Gained 13 pounds since May.  Denies fevers, allergy symptoms currently.  +mild low back pain.  See HPI  PHYSICAL EXAM: BP 110/62  Pulse 72  Ht 5\' 9"  (1.753 m)  Wt 202 lb (91.627 kg)  BMI 29.83 kg/m2 Well developed, pleasant female, accompanied by young son, in no distress HEENT:  PERRL, conjunctiva clear Neck: no lymphadenopathy, thyromegaly or carotid bruit Heart: regular rate and rhythm without murmur Lungs: clear bilaterally Abdomen: soft, nontender, no organomegaly or mass Extremities: no edema Skin: no rash Neuro: alert and oriented.  Normal gait, strength.  Cranial nerves intact Psych: normal mood, affect, hygiene, grooming, speech and eye contact.  Labs:   Chemistry  Component Value Date/Time   NA 138 12/30/2011 0825   K 3.9 12/30/2011 0825   CL 104 12/30/2011 0825   CO2 24 12/30/2011 0825   BUN 9 12/30/2011 0825   CREATININE 0.83 12/30/2011 0825   CREATININE 0.91 02/04/2007 1731      Component Value Date/Time   CALCIUM 8.8 12/30/2011 0825   ALKPHOS 80 12/30/2011 0825   AST 19 12/30/2011 0825   ALT 18 12/30/2011 0825   BILITOT 0.6 12/30/2011 0825     Glucose 79  Lab Results  Component Value Date   CHOL 192 12/30/2011   HDL 41 12/30/2011   LDLCALC 131* 12/30/2011   TRIG 101 12/30/2011   CHOLHDL 4.7 12/30/2011   Lab Results  Component Value Date   TSH 2.773  12/30/2011   Lab Results  Component Value Date   WBC 8.0 12/30/2011   HGB 13.9 12/30/2011   HCT 40.8 12/30/2011   MCV 81.3 12/30/2011   PLT 263 12/30/2011   Vitamin D-OH 40 (normal)   ASSESSMENT/PLAN:  1. Insomnia  citalopram (CELEXA) 40 MG tablet  2. Migraine headache  topiramate (TOPAMAX) 100 MG tablet  3. Need for Tdap vaccination  Tdap vaccine greater than or equal to 7yo IM  4. CLUSTER HEADACHE SYNDROME UNSPECIFIED    5. Depressive disorder, not elsewhere classified    6. Anxiety state, unspecified     Discussed normal lab results; borderline lipids with elevated chol/HDL ratio, low HDL, borderline LDL.  Reviewed need for daily exercise, low cholesterol diet.  Encouraged weight loss.  Discussed healthy diet, portion control.  Headaches--well controlled with topamax. Insomnia/depression and anxiety are all well controlled with celexa.  On high dose, but no side effects, effective, and has been on this dose for quite a while.  Low back pain--overall improved.  Discussed referral for PT if/when desired, if not improving or worsens.  F/u 6 months CPE (no labs needed; recheck labs 1 year)

## 2012-01-01 NOTE — Patient Instructions (Addendum)
    Chemistry      Component Value Date/Time   NA 138 12/30/2011 0825   K 3.9 12/30/2011 0825   CL 104 12/30/2011 0825   CO2 24 12/30/2011 0825   BUN 9 12/30/2011 0825   CREATININE 0.83 12/30/2011 0825   CREATININE 0.91 02/04/2007 1731      Component Value Date/Time   CALCIUM 8.8 12/30/2011 0825   ALKPHOS 80 12/30/2011 0825   AST 19 12/30/2011 0825   ALT 18 12/30/2011 0825   BILITOT 0.6 12/30/2011 0825     Glucose 79  Lab Results  Component Value Date   CHOL 192 12/30/2011   HDL 41 12/30/2011   LDLCALC 131* 12/30/2011   TRIG 101 12/30/2011   CHOLHDL 4.7 12/30/2011   Lab Results  Component Value Date   TSH 2.773 12/30/2011   Lab Results  Component Value Date   WBC 8.0 12/30/2011   HGB 13.9 12/30/2011   HCT 40.8 12/30/2011   MCV 81.3 12/30/2011   PLT 263 12/30/2011   Vitamin D-OH 40 (normal)

## 2012-05-31 ENCOUNTER — Encounter: Payer: BC Managed Care – PPO | Admitting: Family Medicine

## 2012-08-20 ENCOUNTER — Other Ambulatory Visit: Payer: Self-pay | Admitting: Family Medicine

## 2012-08-20 DIAGNOSIS — G4700 Insomnia, unspecified: Secondary | ICD-10-CM

## 2012-08-20 MED ORDER — CITALOPRAM HYDROBROMIDE 40 MG PO TABS: 60.0000 mg | ORAL_TABLET | Freq: Every day | ORAL | Status: DC

## 2012-08-20 NOTE — Telephone Encounter (Signed)
Needs refill citalopram  She has appointment 10/04/12   [facility] delivery

## 2012-08-20 NOTE — Telephone Encounter (Signed)
She was due to f/u in May/June, but she canceled that appt  Will refill only until September appt (2 mos supply, appt is 6 weeks away) given that she is so past due

## 2012-10-04 ENCOUNTER — Other Ambulatory Visit: Payer: Self-pay | Admitting: *Deleted

## 2012-10-04 ENCOUNTER — Encounter: Payer: Self-pay | Admitting: Family Medicine

## 2012-10-04 ENCOUNTER — Ambulatory Visit (INDEPENDENT_AMBULATORY_CARE_PROVIDER_SITE_OTHER): Payer: BC Managed Care – PPO | Admitting: Family Medicine

## 2012-10-04 VITALS — BP 122/76 | HR 72 | Ht 69.0 in | Wt 196.0 lb

## 2012-10-04 DIAGNOSIS — E663 Overweight: Secondary | ICD-10-CM

## 2012-10-04 DIAGNOSIS — G43909 Migraine, unspecified, not intractable, without status migrainosus: Secondary | ICD-10-CM

## 2012-10-04 DIAGNOSIS — Z0000 Encounter for general adult medical examination without abnormal findings: Secondary | ICD-10-CM

## 2012-10-04 DIAGNOSIS — G4700 Insomnia, unspecified: Secondary | ICD-10-CM

## 2012-10-04 DIAGNOSIS — Z6825 Body mass index (BMI) 25.0-25.9, adult: Secondary | ICD-10-CM

## 2012-10-04 DIAGNOSIS — G43109 Migraine with aura, not intractable, without status migrainosus: Secondary | ICD-10-CM

## 2012-10-04 DIAGNOSIS — K219 Gastro-esophageal reflux disease without esophagitis: Secondary | ICD-10-CM | POA: Insufficient documentation

## 2012-10-04 DIAGNOSIS — Z23 Encounter for immunization: Secondary | ICD-10-CM

## 2012-10-04 MED ORDER — CITALOPRAM HYDROBROMIDE 40 MG PO TABS: 60.0000 mg | ORAL_TABLET | Freq: Every day | ORAL | Status: DC

## 2012-10-04 MED ORDER — TOPIRAMATE 100 MG PO TABS: 100.0000 mg | ORAL_TABLET | Freq: Every day | ORAL | Status: DC

## 2012-10-04 NOTE — Patient Instructions (Addendum)
HEALTH MAINTENANCE RECOMMENDATIONS:  It is recommended that you get at least 30 minutes of aerobic exercise at least 5 days/week (for weight loss, you may need as much as 60-90 minutes). This can be any activity that gets your heart rate up. This can be divided in 10-15 minute intervals if needed, but try and build up your endurance at least once a week.  Weight bearing exercise is also recommended twice weekly.  Eat a healthy diet with lots of vegetables, fruits and fiber.  "Colorful" foods have a lot of vitamins (ie green vegetables, tomatoes, red peppers, etc).  Limit sweet tea, regular sodas and alcoholic beverages, all of which has a lot of calories and sugar.  Up to 1 alcoholic drink daily may be beneficial for women (unless trying to lose weight, watch sugars).  Drink a lot of water.  Calcium recommendations are 1200-1500 mg daily (1500 mg for postmenopausal women or women without ovaries), and vitamin D 1000 IU daily.  This should be obtained from diet and/or supplements (vitamins), and calcium should not be taken all at once, but in divided doses.  Monthly self breast exams and yearly mammograms for women over the age of 58 is recommended.  Sunscreen of at least SPF 30 should be used on all sun-exposed parts of the skin when outside between the hours of 10 am and 4 pm (not just when at beach or pool, but even with exercise, golf, tennis, and yard work!)  Use a sunscreen that says "broad spectrum" so it covers both UVA and UVB rays, and make sure to reapply every 1-2 hours.  Remember to change the batteries in your smoke detectors when changing your clock times in the spring and fall.  Use your seat belt every time you are in a car, and please drive safely and not be distracted with cell phones and texting while driving.  Diet for Gastroesophageal Reflux Disease, Adult Reflux (acid reflux) is when acid from your stomach flows up into the esophagus. When acid comes in contact with the  esophagus, the acid causes irritation and soreness (inflammation) in the esophagus. When reflux happens often or so severely that it causes damage to the esophagus, it is called gastroesophageal reflux disease (GERD). Nutrition therapy can help ease the discomfort of GERD. FOODS OR DRINKS TO AVOID OR LIMIT  Smoking or chewing tobacco. Nicotine is one of the most potent stimulants to acid production in the gastrointestinal tract.  Caffeinated and decaffeinated coffee and black tea.  Regular or low-calorie carbonated beverages or energy drinks (caffeine-free carbonated beverages are allowed).   Strong spices, such as black pepper, white pepper, red pepper, cayenne, curry powder, and chili powder.  Peppermint or spearmint.  Chocolate.  High-fat foods, including meats and fried foods. Extra added fats including oils, butter, salad dressings, and nuts. Limit these to less than 8 tsp per day.  Fruits and vegetables if they are not tolerated, such as citrus fruits or tomatoes.  Alcohol.  Any food that seems to aggravate your condition. If you have questions regarding your diet, call your caregiver or a registered dietitian. OTHER THINGS THAT MAY HELP GERD INCLUDE:   Eating your meals slowly, in a relaxed setting.  Eating 5 to 6 small meals per day instead of 3 large meals.  Eliminating food for a period of time if it causes distress.  Not lying down until 3 hours after eating a meal.  Keeping the head of your bed raised 6 to 9 inches (15 to  23 cm) by using a foam wedge or blocks under the legs of the bed. Lying flat may make symptoms worse.  Being physically active. Weight loss may be helpful in reducing reflux in overweight or obese adults.  Wear loose fitting clothing EXAMPLE MEAL PLAN This meal plan is approximately 2,000 calories based on https://www.[facility].org/ meal planning guidelines. Breakfast   cup cooked oatmeal.  1 cup strawberries.  1 cup low-fat milk.  1 oz  almonds. Snack  1 cup cucumber slices.  6 oz yogurt (made from low-fat or fat-free milk). Lunch  2 slice whole-wheat bread.  2 oz sliced [place].  2 tsp mayonnaise.  1 cup blueberries.  1 cup snap peas. Snack  6 whole-wheat crackers.  1 oz string cheese. Dinner   cup brown rice.  1 cup mixed veggies.  1 tsp olive oil.  3 oz grilled fish. Document Released: 12/30/2004 Document Revised: 03/24/2011 Document Reviewed: 11/15/2010 [facility] Patient Information 2014 [place], [place].

## 2012-10-04 NOTE — Progress Notes (Signed)
Chief Complaint  Patient presents with  . Annual Exam    nonfasting annual exam without pap, sees Dr.Adkins and is UTD. Did not do eye exam as she just had exam with Dr.Martinek and got new glasses. No major concerns.    She has been forgetting to take the topamax recently, and so started having some more headaches (still has left some in rx--6 months was prescribed back in December).  She had been taking in the morning, and apparently forgets frequently; better at remembering night-time medication.  When taking topamax daily, it had been very effective in preventing migraines.  She has a mild headache today, that has been there for about a week or so.  Hasn't taken any other medications for the headaches, as they are ineffective.  Headache is only mild, and better after having a weekend where she can catch up on rest.  Insomnia--has been well controlled on 60mg  of citalopram.  Needs refill.  Denies side effects. Denies any symptoms of depression or anxiety.   Immunization History  Administered Date(s) Administered  . Influenza Split 11/14/2011  . Influenza,inj,Quad PF,36+ Mos 10/04/2012  . Tdap 01/01/2012   Last Pap smear: over the summer with Dr. Renaldo Fiddler Last mammogram: summer 2014 through Dr. Renaldo Fiddler Last colonoscopy: never Last DEXA: never Dentist: yearly Ophtho: recent, yearly Exercise:  No regular exercise, just "running around" with kids and at work.  Past Medical History  Diagnosis Date  . Cluster headaches   . Migraine headache   . Seasonal allergies   . Insomnia     thinks there was component of depression--insomnia well treated with celexa  . Pregnancy induced hypertension     with both pregnancies    Past Surgical History  Procedure Laterality Date  . Tonsillectomy  child    History   Social History  . Marital Status: Married    Spouse Name: N/A    Number of Children: 2  . Years of Education: N/A   Occupational History  . adjunct faculty (early childhood  education) [employer]   Social History Main Topics  . Smoking status: Former Smoker    Quit date: 01/14/2003  . Smokeless tobacco: Never Used  . Alcohol Use: Yes     Comment: 4 drinks per week.  . Drug Use: No  . Sexual Activity: Yes    Partners: Male    Birth Control/ Protection: IUD   Other Topics Concern  . Not on file   Social History Narrative   Married, 1 son, 1 daughter. 1 dog    Family History  Problem Relation Age of Onset  . Hypertension Mother   . Cancer Mother 71    breast cancer  . Breast cancer Mother 4  . Hypertension Father   . Tremor Father     essential tremor, s/p surgery  . Hypertension Sister   . Cancer Paternal Aunt     brain  . Cancer Maternal Grandfather   . Hypertension Sister     Current outpatient prescriptions:levonorgestrel (MIRENA) 20 MCG/24HR IUD, 1 each by Intrauterine route once., Disp: , Rfl: ;  cetirizine (ZYRTEC) 10 MG tablet, Take 10 mg by mouth daily., Disp: , Rfl: ;  citalopram (CELEXA) 40 MG tablet, Take 1.5 tablets (60 mg total) by mouth daily., Disp: 135 tablet, Rfl: 3;  topiramate (TOPAMAX) 100 MG tablet, Take 1 tablet (100 mg total) by mouth daily., Disp: 90 tablet, Rfl: 3  Allergies  Allergen Reactions  . Prednisone  REACTION: psychotic   ROS:  The patient denies anorexia, fever, weight changes, vision changes, decreased hearing, ear pain, sore throat, breast concerns, chest pain, palpitations, dizziness, syncope, dyspnea on exertion, cough, swelling, nausea, vomiting, diarrhea, constipation, abdominal pain, melena, hematochezia, indigestion/heartburn, hematuria, incontinence, dysuria, absence of menstrual cycles due to IUD, no vaginal discharge, odor or itch, genital lesions, joint pains, numbness, tingling, weakness, tremor, suspicious skin lesions, depression, anxiety, abnormal bleeding/bruising, or enlarged lymph nodes. +headache per HPI Slight tremor (hereditary, and is controlled topamax) Very mild allergy  symptoms starting Frequent heartburn, for which she takes Tums.  No dysphagia  PHYSICAL EXAM: BP 122/76  Pulse 72  Ht 5\' 9"  (1.753 m)  Wt 196 lb (88.905 kg)  BMI 28.93 kg/m2  General Appearance:    Alert, cooperative, no distress, appears stated age  Head:    Normocephalic, without obvious abnormality, atraumatic  Eyes:    PERRL, conjunctiva/corneas clear, EOM's intact, fundi    benign  Ears:    Normal TM's and external ear canals  Nose:   Nares normal, mucosa normal, no drainage or sinus   tenderness  Throat:   Lips, mucosa, and tongue normal; teeth and gums normal  Neck:   Supple, no lymphadenopathy;  thyroid:  no   enlargement/tenderness/nodules; no carotid   bruit or JVD  Back:    Spine nontender, no curvature, ROM normal, no CVA     tenderness  Lungs:     Clear to auscultation bilaterally without wheezes, rales or     ronchi; respirations unlabored  Chest Wall:    No tenderness or deformity   Heart:    Regular rate and rhythm, S1 and S2 normal, no murmur, rub   or gallop  Breast Exam:    Deferred to GYN  Abdomen:     Soft, non-tender, nondistended, normoactive bowel sounds,    no masses, no hepatosplenomegaly  Genitalia:    Deferred to GYN     Extremities:   No clubbing, cyanosis or edema  Pulses:   2+ and symmetric all extremities  Skin:   Skin color, texture, turgor normal, no rashes or lesions  Lymph nodes:   Cervical, supraclavicular, and axillary nodes normal  Neurologic:   CNII-XII intact, normal strength, sensation and gait; reflexes 2+ and symmetric throughout          Psych:   Normal mood, affect, hygiene and grooming.     Lab Results  Component Value Date   CHOL 192 12/30/2011   HDL 41 12/30/2011   LDLCALC 131* 12/30/2011   TRIG 101 12/30/2011   CHOLHDL 4.7 12/30/2011     Chemistry      Component Value Date/Time   NA 138 12/30/2011 0825   K 3.9 12/30/2011 0825   CL 104 12/30/2011 0825   CO2 24 12/30/2011 0825   BUN 9 12/30/2011 0825   CREATININE  0.83 12/30/2011 0825   CREATININE 0.91 02/04/2007 1731      Component Value Date/Time   CALCIUM 8.8 12/30/2011 0825   ALKPHOS 80 12/30/2011 0825   AST 19 12/30/2011 0825   ALT 18 12/30/2011 0825   BILITOT 0.6 12/30/2011 0825     Glucose 79 Vitamin D-OH 40  Lab Results  Component Value Date   TSH 2.773 12/30/2011   Lab Results  Component Value Date   WBC 8.0 12/30/2011   HGB 13.9 12/30/2011   HCT 40.8 12/30/2011   MCV 81.3 12/30/2011   PLT 263 12/30/2011   ASSESSMENT/PLAN:  Routine general medical  examination at a health care facility  Need for prophylactic vaccination and inoculation against influenza - Plan: Flu Vaccine QUAD 36+ mos IM  Migraine headache with aura  Insomnia - Plan: DISCONTINUED: citalopram (CELEXA) 40 MG tablet  Migraine headache - Plan: DISCONTINUED: topiramate (TOPAMAX) 100 MG tablet  Overweight (BMI 25.0-29.9)  GERD (gastroesophageal reflux disease)  All labs reviewed with pt in detail.  Lipids borderline, with low HDL and elevated chol/HDL ratio, borderline LDL.  GERD--counseled re: diet/behavioral measures in detail.  Use Prilosec daily for 2-4 weeks then try to change to prn, if able (was able to have long term success in past).  Encouraged weight loss  Counseled re: diet and exercise extensively rec 30 pound weight loss to get back where she was in 2010-2011 Will need lipids checked next year  Discussed monthly self breast exams and yearly mammograms after the age of 75; at least 30 minutes of aerobic activity at least 5 days/week; proper sunscreen use reviewed; healthy diet, including goals of calcium and vitamin D intake and alcohol recommendations (less than or equal to 1 drink/day) reviewed; regular seatbelt use; changing batteries in smoke detectors.  Immunization recommendations discussed--flu shot given.  Colonoscopy recommendations reviewed

## 2012-10-06 ENCOUNTER — Other Ambulatory Visit: Payer: Self-pay | Admitting: *Deleted

## 2012-11-29 ENCOUNTER — Ambulatory Visit (INDEPENDENT_AMBULATORY_CARE_PROVIDER_SITE_OTHER): Payer: BC Managed Care – PPO | Admitting: Medical

## 2012-11-29 ENCOUNTER — Encounter: Payer: Self-pay | Admitting: Medical

## 2012-11-29 VITALS — BP 130/80 | HR 89 | Temp 98.4°F | Resp 18 | Wt 195.0 lb

## 2012-11-29 DIAGNOSIS — R05 Cough: Secondary | ICD-10-CM

## 2012-11-29 DIAGNOSIS — R059 Cough, unspecified: Secondary | ICD-10-CM

## 2012-11-29 DIAGNOSIS — J309 Allergic rhinitis, unspecified: Secondary | ICD-10-CM | POA: Diagnosis not present

## 2012-11-29 DIAGNOSIS — R0602 Shortness of breath: Secondary | ICD-10-CM | POA: Diagnosis not present

## 2012-11-29 MED ORDER — ALBUTEROL SULFATE HFA 108 (90 BASE) MCG/ACT IN AERS: 2.0000 | INHALATION_SPRAY | Freq: Four times a day (QID) | RESPIRATORY_TRACT | Status: DC | PRN

## 2012-11-29 MED ORDER — BENZONATATE 200 MG PO CAPS: 200.0000 mg | ORAL_CAPSULE | Freq: Two times a day (BID) | ORAL | Status: DC | PRN

## 2012-11-29 NOTE — Patient Instructions (Signed)
Currently your exam and symptoms suggest either viral respiratory infection vs allergen trigger.  Begin OTC antihistamine such as benadryl or zyrtec.   Rest, hydrate well, don't do anything pparticularlyaactivethe next few days.  Use albuterol inhaler, 1-2 puffs, every 4-6 hours as needed for cough /shortness of breath.    I also sent tessalon Perles cough drops for as needed use.

## 2012-11-29 NOTE — Progress Notes (Signed)
Subjective:  Brenda Haynes is a 42 y.o. female who presents for 1 wk hx/o shortness of breath.  Symptoms include SOB, slight nasal congestion, intermittent bad cough spells, burning in chest.  The burning in chest improved some.  Getting worse with SOB.  Best with sitting still.  Exertion worsens symptoms, particularly with stairs.  She does report sneezing, some burning eyes, nasal congestion, fall allergies flared up this year compared to prior years.  Denies fever, NV, sore throat, ear fullness, no hx/o lung disease, no hemoptysis, no weight gain.  No recent travel or calve pain, no hx/o DVT or PE, no hx/o heart failure.  No paroxysmal nocturnal dyspnea.  Her daughter just got over lung infection, had been around her.  She was on prednisone and albuterol.  She was treated for pneumonia 3 years ago.   No recent fume exposures.  No other aggravating or relieving factors.  No other c/o.  The following portions of the patient's history were reviewed and updated as appropriate: allergies, current medications, past family history, past medical history, past social history, past surgical history and problem list.  ROS as in subjective  Past Medical History  Diagnosis Date  . Cluster headaches   . Migraine headache   . Seasonal allergies   . Insomnia     thinks there was component of depression--insomnia well treated with celexa  . Pregnancy induced hypertension     with both pregnancies     Objective: Vital signs reviewed  General appearance: Alert, WD/WN, no distress                             Skin: warm, no rash, no diaphoresis                           Head: no sinus tenderness                            Eyes: conjunctiva normal, corneas clear, PERRLA                            Ears: pearly TMs, external ear canals normal                          Nose: septum midline, turbinates swollen, with clear discharge             Mouth/throat: MMM, tongue normal, mild pharyngeal erythema                     Neck: supple, no adenopathy, no thyromegaly, nontender, no JVD                          Heart: RRR, normal S1, S2, no murmurs                         Lungs: clear, no rhonchi, no wheezes, no rales                Extremities: no edema, nontender     Assessment: Encounter Diagnoses  Name Primary?  . SOB (shortness of breath) Yes  . Cough   . Allergic rhinitis     Plan:  Symptoms and exam suggests allergic trigger vs viral respiratory infection with associated  SOB.   Gave 1 round of albuterol neb in office with immediate improvement.   At this point, begin OTC antihistamine, rest, hydrate well, avoid strenuous activity, and use albuterol inhaler prn.  If not much improved or worse in the next week, then return for recheck.

## 2013-10-13 ENCOUNTER — Encounter: Payer: Self-pay | Admitting: Family Medicine

## 2013-10-13 ENCOUNTER — Ambulatory Visit (INDEPENDENT_AMBULATORY_CARE_PROVIDER_SITE_OTHER): Payer: Managed Care, Other (non HMO) | Admitting: Family Medicine

## 2013-10-13 VITALS — BP 120/76 | HR 68 | Ht 69.0 in | Wt 170.0 lb

## 2013-10-13 DIAGNOSIS — F324 Major depressive disorder, single episode, in partial remission: Secondary | ICD-10-CM

## 2013-10-13 DIAGNOSIS — F411 Generalized anxiety disorder: Secondary | ICD-10-CM

## 2013-10-13 DIAGNOSIS — F325 Major depressive disorder, single episode, in full remission: Secondary | ICD-10-CM

## 2013-10-13 DIAGNOSIS — Z23 Encounter for immunization: Secondary | ICD-10-CM

## 2013-10-13 DIAGNOSIS — G44019 Episodic cluster headache, not intractable: Secondary | ICD-10-CM

## 2013-10-13 DIAGNOSIS — Z0000 Encounter for general adult medical examination without abnormal findings: Secondary | ICD-10-CM

## 2013-10-13 DIAGNOSIS — G4700 Insomnia, unspecified: Secondary | ICD-10-CM

## 2013-10-13 DIAGNOSIS — Z79899 Other long term (current) drug therapy: Secondary | ICD-10-CM

## 2013-10-13 DIAGNOSIS — G43109 Migraine with aura, not intractable, without status migrainosus: Secondary | ICD-10-CM

## 2013-10-13 LAB — TSH: TSH: 2.169 u[IU]/mL (ref 0.350–4.500)

## 2013-10-13 LAB — LIPID PANEL
CHOL/HDL RATIO: 3.8 ratio
Cholesterol: 184 mg/dL (ref 0–200)
HDL: 48 mg/dL (ref >39)
LDL Cholesterol: 120 mg/dL — ABNORMAL HIGH (ref 0–99)
TRIGLYCERIDES: 79 mg/dL (ref <150)
VLDL: 16 mg/dL (ref 0–40)

## 2013-10-13 LAB — COMPREHENSIVE METABOLIC PANEL
ALK PHOS: 71 U/L (ref 39–117)
ALT: 12 U/L (ref 0–35)
AST: 14 U/L (ref 0–37)
Albumin: 4.4 g/dL (ref 3.5–5.2)
BILIRUBIN TOTAL: 0.6 mg/dL (ref 0.2–1.2)
BUN: 11 mg/dL (ref 6–23)
CO2: 23 mEq/L (ref 19–32)
CREATININE: 0.99 mg/dL (ref 0.50–1.10)
Calcium: 9.7 mg/dL (ref 8.4–10.5)
Chloride: 106 mEq/L (ref 96–112)
GLUCOSE: 80 mg/dL (ref 70–99)
Potassium: 4.1 mEq/L (ref 3.5–5.3)
Sodium: 137 mEq/L (ref 135–145)
Total Protein: 6.4 g/dL (ref 6.0–8.3)

## 2013-10-13 LAB — POCT URINALYSIS DIPSTICK
BILIRUBIN UA: NEGATIVE
Blood, UA: NEGATIVE
GLUCOSE UA: NEGATIVE
KETONES UA: NEGATIVE
NITRITE UA: NEGATIVE
Protein, UA: NEGATIVE
Spec Grav, UA: <=1.005
Urobilinogen, UA: NEGATIVE
pH, UA: 7

## 2013-10-13 LAB — CBC WITH DIFFERENTIAL/PLATELET
BASOS ABS: 0.1 10*3/uL (ref 0.0–0.1)
Basophils Relative: 1 % (ref 0–1)
Eosinophils Absolute: 0.2 10*3/uL (ref 0.0–0.7)
Eosinophils Relative: 2 % (ref 0–5)
HCT: 44.2 % (ref 36.0–46.0)
Hemoglobin: 14.9 g/dL (ref 12.0–15.0)
LYMPHS ABS: 2.1 10*3/uL (ref 0.7–4.0)
LYMPHS PCT: 27 % (ref 12–46)
MCH: 28.2 pg (ref 26.0–34.0)
MCHC: 33.7 g/dL (ref 30.0–36.0)
MCV: 83.7 fL (ref 78.0–100.0)
MONO ABS: 0.6 10*3/uL (ref 0.1–1.0)
Monocytes Relative: 7 % (ref 3–12)
NEUTROS ABS: 5 10*3/uL (ref 1.7–7.7)
Neutrophils Relative %: 63 % (ref 43–77)
Platelets: 303 10*3/uL (ref 150–400)
RBC: 5.28 MIL/uL — AB (ref 3.87–5.11)
RDW: 14.2 % (ref 11.5–15.5)
WBC: 7.9 10*3/uL (ref 4.0–10.5)

## 2013-10-13 MED ORDER — TOPIRAMATE 100 MG PO TABS: 100.0000 mg | ORAL_TABLET | Freq: Every day | ORAL | Status: DC

## 2013-10-13 MED ORDER — CITALOPRAM HYDROBROMIDE 40 MG PO TABS: 60.0000 mg | ORAL_TABLET | Freq: Every day | ORAL | Status: DC

## 2013-10-13 NOTE — Progress Notes (Signed)
Chief Complaint  Patient presents with  . Annual Exam    fasting annual exam, no pap-sees Dr.Atkins and is UTD. UA showed 1+ leuks, no symptoms. No concerns.    Brenda Haynes is a 43 y.o. female who presents for a complete physical.  She has no specific complaints.  Depression/Anxiety/insomnia:  Has been on 60mg  of citalopram for about 3 years.  Symptoms are well controlled--no longer having any insomnia.  Moods are good. Denies any side effects.  She recently lost a family friend--handling it okay, but sad.  Cluster and Migraine headaches:  She takes topamax at bedtime, regularly without missing pills.  She only gets headaches very infrequently (not even monthly); hasn't had a full blown migraine in about a year; last tension headache was a few months ago.  Mild headache today due to not eating for fasting labs.  Allergies:  Well controlled on zyrtec.   Immunization History  Administered Date(s) Administered  . Influenza Split 11/14/2011  . Influenza,inj,Quad PF,36+ Mos 10/04/2012, 10/13/2013  . Tdap 01/01/2012   Last Pap smear: 06/2012 with Dr. Renaldo Fiddler; has appt 12/2013 Last mammogram: summer 2014 through Dr. Renaldo Fiddler --will do at her office in 12/2013 Last colonoscopy: never  Last DEXA: never  Dentist: yearly  Ophtho: recent, yearly  Exercise: She had been walking regularly in the Spring, but no regular exercise now.  She lost 26 pounds since her last physical--healthier eating and regular exercise.  Past Medical History  Diagnosis Date  . Cluster headaches   . Migraine headache   . Seasonal allergies   . Insomnia     thinks there was component of depression--insomnia well treated with celexa  . Pregnancy induced hypertension     with both pregnancies    Past Surgical History  Procedure Laterality Date  . Tonsillectomy  child    History   Social History  . Marital Status: Married    Spouse Name: N/A    Number of Children: 2  . Years of Education: N/A    Occupational History  . 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 Smoking status: Former Smoker    Quit date: 01/14/2003  . Smokeless tobacco: Never Used  . Alcohol Use: Yes     Comment: 4 drinks per week.  . Drug Use: No  . Sexual Activity: Yes    Partners: Male    Birth Control/ Protection: IUD   Other Topics Concern  . Not on file   Social History Narrative   Married, 1 son, 1 daughter. 1 dog    Family History  Problem Relation Age of Onset  . Hypertension Mother   . Cancer Mother 59    breast cancer  . Breast cancer Mother 46  . Hypertension Father   . Tremor Father     essential tremor, s/p surgery  . Dementia Father   . Hypertension Sister   . Cancer Paternal Aunt     brain  . Cancer Maternal Grandfather     fibrous histocytoma  . Hypertension Sister   . Dementia Paternal Aunt   . Dementia Paternal Aunt    Outpatient Encounter Prescriptions as of 10/13/2013  Medication Sig  . cetirizine (ZYRTEC) 10 MG tablet Take 10 mg by mouth daily.  . citalopram (CELEXA) 40 MG tablet Take 1.5 tablets (60 mg total) by mouth daily.  Marland Kitchen levonorgestrel (MIRENA) 20 MCG/24HR IUD 1 each by Intrauterine route once.  . topiramate (TOPAMAX) 100 MG  tablet Take 1 tablet (100 mg total) by mouth daily.  . [DISCONTINUED] albuterol (PROVENTIL HFA;VENTOLIN HFA) 108 (90 BASE) MCG/ACT inhaler Inhale 2 puffs into the lungs every 6 (six) hours as needed for wheezing or shortness of breath.  . [DISCONTINUED] benzonatate (TESSALON) 200 MG capsule Take 1 capsule (200 mg total) by mouth 2 (two) times daily as needed for cough.    Allergies  Allergen Reactions  . Prednisone     REACTION: psychotic    ROS: The patient denies anorexia, fever,  vision changes, decreased hearing, ear pain, sore throat, breast concerns, chest pain, palpitations, dizziness, syncope, dyspnea on exertion, cough, swelling, nausea, vomiting, diarrhea, constipation,  abdominal pain, melena, hematochezia, indigestion/heartburn, hematuria, incontinence, dysuria, absence of menstrual cycles due to IUD, no vaginal discharge, odor or itch, genital lesions, joint pains, numbness, tingling, weakness, tremor, suspicious skin lesions, depression, anxiety, abnormal bleeding/bruising, or enlarged lymph nodes.  H/o slight tremor (hereditary, and is controlled topamax--no longer noticeable)  mild allergy symptoms starting--controlled with zyrtec. Intentional weight loss   PHYSICAL EXAM:  BP 120/76  Pulse 68  Ht 5\' 9"  (1.753 m)  Wt 170 lb (77.111 kg)  BMI 25.09 kg/m2   General Appearance:  Alert, cooperative, no distress, appears stated age   Head:  Normocephalic, without obvious abnormality, atraumatic   Eyes:  PERRL, conjunctiva/corneas clear, EOM's intact, fundi  benign   Ears:  Normal TM's and external ear canals   Nose:  Nares normal, mucosa normal, no drainage or sinus tenderness   Throat:  Lips, mucosa, and tongue normal; teeth and gums normal   Neck:  Supple, no lymphadenopathy; thyroid: no enlargement/tenderness/nodules; no carotid  bruit or JVD   Back:  Spine nontender, no curvature, ROM normal, no CVA tenderness   Lungs:  Clear to auscultation bilaterally without wheezes, rales or ronchi; respirations unlabored   Chest Wall:  No tenderness or deformity   Heart:  Regular rate and rhythm, S1 and S2 normal, no murmur, rub  or gallop   Breast Exam:  Deferred to GYN   Abdomen:  Soft, non-tender, nondistended, normoactive bowel sounds,  no masses, no hepatosplenomegaly   Genitalia:  Deferred to GYN      Extremities:  No clubbing, cyanosis or edema   Pulses:  2+ and symmetric all extremities   Skin:  Skin color, texture, turgor normal, no rashes; actinic/sun damage noted.  No suspicious lesions   Lymph nodes:  Cervical, supraclavicular, and axillary nodes normal   Neurologic:  CNII-XII intact, normal strength, sensation and gait; reflexes 2+ and  symmetric throughout          Psych: Normal mood, affect, hygiene and grooming.   ASSESSMENT/PLAN:  Annual physical exam - Plan: POCT Urinalysis Dipstick, Lipid panel, Comprehensive metabolic panel, TSH, CBC with Differential  Need for prophylactic vaccination and inoculation against influenza - Plan: Flu Vaccine QUAD 36+ mos PF IM (Fluarix Quad PF), Visual acuity screening  Migraine with aura and without status migrainosus, not intractable - Plan: topiramate (TOPAMAX) 100 MG tablet  Depression, major, in remission - Plan: citalopram (CELEXA) 40 MG tablet  Generalized anxiety disorder - Plan: citalopram (CELEXA) 40 MG tablet  Episodic cluster headache, not intractable  Encounter for long-term (current) use of medications - Plan: Comprehensive metabolic panel, TSH, CBC with Differential  Insomnia - resolved on Citalopram - Plan: citalopram (CELEXA) 40 MG tablet   Discussed considering trial of tapering down to 40mg  of citalopram when in a less stressful point in life (recent death of a  friend). If doing well after a few months on 40mg , can consider tapering further.  Discussed monthly self breast exams and yearly mammograms after the age of 42; at least 30 minutes of aerobic activity at least 5 days/week; proper sunscreen use reviewed; healthy diet, including goals of calcium and vitamin D intake and alcohol recommendations (less than or equal to 1 drink/day) reviewed; regular seatbelt use; changing batteries in smoke detectors.  Immunization recommendations discussed--flu shot today.  Colonoscopy recommendations reviewed--age 29

## 2013-10-13 NOTE — Patient Instructions (Signed)

## 2013-11-14 ENCOUNTER — Encounter: Payer: Self-pay | Admitting: Family Medicine

## 2013-12-28 ENCOUNTER — Ambulatory Visit (INDEPENDENT_AMBULATORY_CARE_PROVIDER_SITE_OTHER): Payer: Managed Care, Other (non HMO) | Admitting: Medical

## 2013-12-28 ENCOUNTER — Encounter: Payer: Self-pay | Admitting: Medical

## 2013-12-28 VITALS — BP 110/80 | HR 99 | Temp 98.3°F | Resp 16 | Wt 175.0 lb

## 2013-12-28 DIAGNOSIS — J069 Acute upper respiratory infection, unspecified: Secondary | ICD-10-CM

## 2013-12-28 DIAGNOSIS — J209 Acute bronchitis, unspecified: Secondary | ICD-10-CM

## 2013-12-28 DIAGNOSIS — R509 Fever, unspecified: Secondary | ICD-10-CM

## 2013-12-28 MED ORDER — BENZONATATE 200 MG PO CAPS: 200.0000 mg | ORAL_CAPSULE | Freq: Three times a day (TID) | ORAL | Status: DC | PRN

## 2013-12-28 MED ORDER — ALBUTEROL SULFATE HFA 108 (90 BASE) MCG/ACT IN AERS: 2.0000 | INHALATION_SPRAY | Freq: Four times a day (QID) | RESPIRATORY_TRACT | Status: DC | PRN

## 2013-12-28 NOTE — Progress Notes (Signed)
Subjective: Here for illness.  Started 5 days ago with fever up to 102, fatigue, lungs feel congested, but now cough is worsening, some sinus pressure, ear pain, nasal congestion.  Feels a little SOB.  No sore throat, no NVD.  No chest pain, no leg swelling.  No sick contacts specifically.   Using Nyquil for symptoms.  No other aggravating or relieving factors. No other complaint.  ROS as in subjective  Objective: Filed Vitals:   12/28/13 0815  BP: 110/80  Pulse: 99  Temp: 98.3 F (36.8 C)  Resp: 16    General appearance: alert, no distress, WD/WN HEENT: normocephalic, sclerae anicteric, TMs pearly, nares with mild turbinated edema, clear discharge, erythema, pharynx with mild erythema Oral cavity: MMM, no lesions Neck: supple, no lymphadenopathy, no thyromegaly, no masses Heart: RRR, normal S1, S2, no murmurs Lungs: bronchial breath sounds, otherwise no wheezes, rhonchi, or rales   Assessment: Encounter Diagnoses  Name Primary?  . Acute bronchitis, unspecified organism Yes  . Acute upper respiratory infection   . Fever in adult    Plan: Symptoms and exam suggestive viral or flu like illness.  At this point, increase water intake, rest, can c/t dayquil/nuyquil, begin Tessalon Perles prn, Albuterol inhaler prn.  If not seeing improvement by Friday, call back.

## 2013-12-30 ENCOUNTER — Telehealth: Payer: Self-pay | Admitting: Family Medicine

## 2013-12-30 ENCOUNTER — Other Ambulatory Visit: Payer: Self-pay | Admitting: Medical

## 2013-12-30 MED ORDER — AZITHROMYCIN 250 MG PO TABS: ORAL_TABLET | ORAL | Status: DC

## 2013-12-30 NOTE — Telephone Encounter (Signed)
Pt says she is not better. She is on day #7 of fever and cough is worse. Can she get med? Pt says chart should show allergy to prednisone but last time she tried prednisone was almost 7 years ago & pt says she so sick that she is willing to try it again now.

## 2013-12-30 NOTE — Telephone Encounter (Signed)
zpak sent, lets add this antibiotic.

## 2013-12-30 NOTE — Telephone Encounter (Signed)
Called pt to inform that med called in to pharmacy

## 2014-01-16 ENCOUNTER — Encounter: Payer: Self-pay | Admitting: Family Medicine

## 2014-01-16 ENCOUNTER — Ambulatory Visit (INDEPENDENT_AMBULATORY_CARE_PROVIDER_SITE_OTHER): Payer: Managed Care, Other (non HMO) | Admitting: Family Medicine

## 2014-01-16 VITALS — BP 118/72 | HR 72 | Temp 98.1°F | Ht 69.0 in | Wt 179.0 lb

## 2014-01-16 DIAGNOSIS — R059 Cough, unspecified: Secondary | ICD-10-CM

## 2014-01-16 DIAGNOSIS — J019 Acute sinusitis, unspecified: Secondary | ICD-10-CM

## 2014-01-16 DIAGNOSIS — J209 Acute bronchitis, unspecified: Secondary | ICD-10-CM

## 2014-01-16 DIAGNOSIS — R05 Cough: Secondary | ICD-10-CM

## 2014-01-16 MED ORDER — AMOXICILLIN-POT CLAVULANATE 875-125 MG PO TABS: 1.0000 | ORAL_TABLET | Freq: Two times a day (BID) | ORAL | Status: DC

## 2014-01-16 MED ORDER — HYDROCODONE-HOMATROPINE 5-1.5 MG/5ML PO SYRP: 5.0000 mL | ORAL_SOLUTION | Freq: Three times a day (TID) | ORAL | Status: DC | PRN

## 2014-01-16 NOTE — Progress Notes (Signed)
Chief Complaint  Patient presents with  . Cough    saw Brenda Haynes 12/28/13, never really got any better. Still has cough and lack of energy. Fever lasted 10 days but now gone.    She started with fever and cough and congestion back on 12/11.  She saw Brenda Haynes on the 16th.  It was felt to be viral, and was prescribed albuterol and Tessalon, then called 2 days later not any better and was prescribed a z-pak.  The fever resolved with the antibiotics, but the cough never improved.  The inhaler only helped a little. She states the tessalon didn't help at all.  She has some nasal congestion, drainage is yellowish.  Denies sinus pain or pressure, but does have some postnasal drainage.  Cough is often dry, but also productive of yellowish phlegm.  No fever or chills.  Denies shortness of breath, chest pain.  She has been using Mucinex-DM 12 hour and Nyquil at night.  She has not been using the inhaler.    PMH, PSH, SH reviewed.  Outpatient Encounter Prescriptions as of 01/16/2014  Medication Sig  . citalopram (CELEXA) 40 MG tablet Take 1.5 tablets (60 mg total) by mouth daily. (Patient taking differently: Take 40 mg by mouth daily. )  . dextromethorphan-guaiFENesin (MUCINEX DM) 30-600 MG per 12 hr tablet Take 1 tablet by mouth 2 (two) times daily.  Marland Kitchen levonorgestrel (MIRENA) 20 MCG/24HR IUD 1 each by Intrauterine route once.  . Pseudoeph-Doxylamine-DM-APAP (NYQUIL PO) Take 30 mLs by mouth at bedtime.  . topiramate (TOPAMAX) 100 MG tablet Take 1 tablet (100 mg total) by mouth daily.  Marland Kitchen albuterol (PROVENTIL HFA;VENTOLIN HFA) 108 (90 BASE) MCG/ACT inhaler Inhale 2 puffs into the lungs every 6 (six) hours as needed for wheezing or shortness of breath. (Patient not taking: Reported on 01/16/2014)  . benzonatate (TESSALON) 200 MG capsule Take 1 capsule (200 mg total) by mouth 3 (three) times daily as needed for cough. (Patient not taking: Reported on 01/16/2014)  . cetirizine (ZYRTEC) 10 MG tablet Take 10 mg by mouth  daily.  . [DISCONTINUED] azithromycin (ZITHROMAX) 250 MG tablet 2 tablets day 1, then 1 tablet days 2-4   (currently NOT taking tessalon or albuterol)  Allergies  Allergen Reactions  . Prednisone     REACTION: psychotic   ROS:  Denies nausea, vomiting, diarrhea, dizziness, chest pain, palpitations, rashes, bleeding, bruising, urinary complaints (mild stress incontinence). No fever, chills, arthralgias.  PHYSICAL EXAM: BP 118/72 mmHg  Pulse 72  Temp(Src) 98.1 F (36.7 C) (Tympanic)  Ht  (1.753 m)  Wt 179 lb (81.194 kg)  BMI 26.42 kg/m2  Frequent hacky and deep coughing spells while in exam room. Speaking easily in full sentences. HEENT: PERRL, EOMI, conjunctiva clear.  TM's and EAC's normal. Nasal mucosa is erythematous and mild-mod edematous L>R. Sinuses are nontender.  OP is clear Neck: no lymphadenopathy Heart: regular rate and rhythm Lungs: some inspiratory squeaks and wheezes; deep breaths triggered a lot of coughing. No rales or ronchi Skin: no rash  ASSESSMENT/PLAN:  Acute sinusitis, recurrence not specified, unspecified location - Plan: amoxicillin-clavulanate (AUGMENTIN) 875-125 MG per tablet  Acute bronchitis, unspecified organism - Plan: amoxicillin-clavulanate (AUGMENTIN) 875-125 MG per tablet  Cough - Plan: HYDROcodone-homatropine (HYCODAN) 5-1.5 MG/5ML syrup   Sinusitis and bronchitis--failed z-pak. Treat with Augmentin. Continue Mucinex. Add decongestant Hycodan syrup as needed (mostly at bedtime, since is sedating--use with extreme caution during the day; don't drive after using).  Risks/side effects of meds reviewed.  F/u prn

## 2014-01-16 NOTE — Patient Instructions (Signed)
Continue to drink plenty of fluids. Take Augmentin twice daily for 10 days.  Call if symptoms are getting worse rather than better, or if they don't completely resolve after the full 10 days. Continue Mucinex. Add decongestant (ie sudafed, or a combination such as claritin-D) Hycodan syrup as needed (mostly at bedtime, since is sedating--use with extreme caution during the day; don't drive after using). Consider using probiotic if you develop any diarrhea.

## 2014-01-23 ENCOUNTER — Telehealth: Payer: Self-pay | Admitting: Family Medicine

## 2014-01-23 NOTE — Telephone Encounter (Signed)
Is mucus clear, discolored (this is something that might be improving, yet still not feel great).  If not discolored or having fever, complete the antibiotic and call with update later this week.

## 2014-01-23 NOTE — Telephone Encounter (Signed)
Spoke with patient and she will continue on augmentin as her mucus is clear, she will call back Thurs afternoon if not better.

## 2014-01-23 NOTE — Telephone Encounter (Signed)
Patient on day #7 of her antibiotic and has 3 more days left. Still has stuffy head-nose, low energy, tired, cough is better but still has it, was short of breath yesterday. What should she do? Should she be much better by now?

## 2014-01-27 ENCOUNTER — Telehealth: Payer: Self-pay | Admitting: Family Medicine

## 2014-01-27 NOTE — Telephone Encounter (Signed)
Pt still not better. Has nasal congestion and drip, stuffy head. Cough almost gone but still there. Pt said she was asked to call if she was not better.

## 2014-01-27 NOTE — Telephone Encounter (Signed)
Patient is aware of Dr. Delford FieldKnapp's message in detail and Dr. Delford FieldKnapp's recommendations

## 2014-01-27 NOTE — Telephone Encounter (Signed)
Per phone call earlier this week, mucus was clear.  If mucus is still clear, no fever, and cough is almost gone, then I don't believe any additional antibiotics are needed.  Continue with supportive measures--decongestants, mucinex and zyrtec. Call if recurrent discolored mucus/phlegm, fevers

## 2014-06-16 ENCOUNTER — Ambulatory Visit
Admission: RE | Admit: 2014-06-16 | Discharge: 2014-06-16 | Disposition: A | Discharge status: Home / Self Care | Payer: Managed Care, Other (non HMO) | Source: Ambulatory Visit | Attending: Medical | Admitting: Medical

## 2014-06-16 ENCOUNTER — Telehealth: Payer: Self-pay | Admitting: Medical

## 2014-06-16 ENCOUNTER — Ambulatory Visit (INDEPENDENT_AMBULATORY_CARE_PROVIDER_SITE_OTHER): Payer: Managed Care, Other (non HMO) | Admitting: Medical

## 2014-06-16 ENCOUNTER — Encounter: Payer: Self-pay | Admitting: Medical

## 2014-06-16 VITALS — BP 120/70 | HR 82 | Temp 98.2°F | Resp 15 | Wt 185.0 lb

## 2014-06-16 DIAGNOSIS — R51 Headache: Principal | ICD-10-CM

## 2014-06-16 DIAGNOSIS — W19XXXA Unspecified fall, initial encounter: Secondary | ICD-10-CM

## 2014-06-16 DIAGNOSIS — M542 Cervicalgia: Secondary | ICD-10-CM | POA: Diagnosis not present

## 2014-06-16 DIAGNOSIS — R519 Headache, unspecified: Secondary | ICD-10-CM

## 2014-06-16 NOTE — Telephone Encounter (Signed)
[facility] today at 1:00pm for CT Maxillofacial and xray, approval A [id number]
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
## 2014-06-16 NOTE — Telephone Encounter (Signed)
Please set up maxillofacial CT and she will be getting C spine xray today too.  Please set up for today.      Injury was fall, tripped over boxes, landed on face, neck hyperextended, possible mild concussion

## 2014-10-19 ENCOUNTER — Encounter: Payer: Self-pay | Admitting: Family Medicine

## 2014-10-19 ENCOUNTER — Ambulatory Visit (INDEPENDENT_AMBULATORY_CARE_PROVIDER_SITE_OTHER): Payer: Managed Care, Other (non HMO) | Admitting: Family Medicine

## 2014-10-19 VITALS — BP 138/88 | HR 76 | Ht 69.5 in | Wt 184.0 lb

## 2014-10-19 DIAGNOSIS — E7800 Pure hypercholesterolemia, unspecified: Secondary | ICD-10-CM | POA: Diagnosis not present

## 2014-10-19 DIAGNOSIS — L659 Nonscarring hair loss, unspecified: Secondary | ICD-10-CM

## 2014-10-19 DIAGNOSIS — Z0000 Encounter for general adult medical examination without abnormal findings: Secondary | ICD-10-CM

## 2014-10-19 DIAGNOSIS — Z23 Encounter for immunization: Secondary | ICD-10-CM

## 2014-10-19 DIAGNOSIS — L650 Telogen effluvium: Secondary | ICD-10-CM | POA: Diagnosis not present

## 2014-10-19 DIAGNOSIS — G43109 Migraine with aura, not intractable, without status migrainosus: Secondary | ICD-10-CM

## 2014-10-19 DIAGNOSIS — Z5181 Encounter for therapeutic drug level monitoring: Secondary | ICD-10-CM | POA: Diagnosis not present

## 2014-10-19 DIAGNOSIS — F325 Major depressive disorder, single episode, in full remission: Secondary | ICD-10-CM

## 2014-10-19 LAB — COMPREHENSIVE METABOLIC PANEL
ALBUMIN: 4.1 g/dL (ref 3.6–5.1)
ALT: 19 U/L (ref 6–29)
AST: 16 U/L (ref 10–30)
Alkaline Phosphatase: 69 U/L (ref 33–115)
BUN: 10 mg/dL (ref 7–25)
CALCIUM: 8.9 mg/dL (ref 8.6–10.2)
CHLORIDE: 109 mmol/L (ref 98–110)
CO2: 25 mmol/L (ref 20–31)
Creat: 0.83 mg/dL (ref 0.50–1.10)
Glucose, Bld: 75 mg/dL (ref 65–99)
POTASSIUM: 4.2 mmol/L (ref 3.5–5.3)
Sodium: 141 mmol/L (ref 135–146)
Total Bilirubin: 0.6 mg/dL (ref 0.2–1.2)
Total Protein: 6.4 g/dL (ref 6.1–8.1)

## 2014-10-19 LAB — LIPID PANEL
CHOL/HDL RATIO: 4.6 ratio (ref <=5.0)
Cholesterol: 179 mg/dL (ref 125–200)
HDL: 39 mg/dL — AB (ref >=46)
LDL CALC: 115 mg/dL (ref <130)
TRIGLYCERIDES: 123 mg/dL (ref <150)
VLDL: 25 mg/dL (ref <30)

## 2014-10-19 LAB — POCT URINALYSIS DIPSTICK
BILIRUBIN UA: NEGATIVE
GLUCOSE UA: NEGATIVE
Ketones, UA: NEGATIVE
Leukocytes, UA: NEGATIVE
Nitrite, UA: NEGATIVE
Protein, UA: NEGATIVE
RBC UA: NEGATIVE
SPEC GRAV UA: 1.01
UROBILINOGEN UA: NEGATIVE
pH, UA: 7.5

## 2014-10-19 MED ORDER — TOPIRAMATE 100 MG PO TABS: 100.0000 mg | ORAL_TABLET | Freq: Every day | ORAL | Status: AC

## 2014-10-19 MED ORDER — CITALOPRAM HYDROBROMIDE 40 MG PO TABS: 40.0000 mg | ORAL_TABLET | Freq: Every day | ORAL | Status: AC
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
## 2014-10-19 NOTE — Patient Instructions (Signed)

## 2014-10-20 LAB — TSH: TSH: 2.522 u[IU]/mL (ref 0.350–4.500)

## 2015-04-24 ENCOUNTER — Telehealth: Payer: Self-pay

## 2015-04-24 DIAGNOSIS — R519 Headache, unspecified: Secondary | ICD-10-CM

## 2015-04-24 DIAGNOSIS — R51 Headache: Principal | ICD-10-CM

## 2015-04-24 MED ORDER — CYCLOBENZAPRINE HCL 10 MG PO TABS: 5.0000 mg | ORAL_TABLET | Freq: Three times a day (TID) | ORAL | Status: AC | PRN

## 2015-04-24 NOTE — Telephone Encounter (Signed)
LMTCB

## 2015-04-24 NOTE — Telephone Encounter (Signed)
Needs OV (FYI please don't close the encounters when you send them back)

## 2015-04-24 NOTE — Telephone Encounter (Signed)
Left detailed on VM 

## 2015-04-24 NOTE — Telephone Encounter (Signed)
Pt stated she can not come in this week and she will just "deal with it" so just wanted to let you know

## 2015-04-24 NOTE — Telephone Encounter (Signed)
Chart reviewed--looks like flexeril was prescribed back in 2013 (for back pain at that time).  I sent in a refill. Her pharmacy wasn't specified in message--I sent it to her [facility] on [address]. She needs to remember that this can be very sedating when taken during the day, and to use caution with driving.  This is not typical for "cluster headache", but muscular headaches/tension.  Heat, stretches, massage can also be helpful  If not responding, needs OV.

## 2015-04-24 NOTE — Telephone Encounter (Signed)
I have no record in epic of medications that she has used for this in the past (just the topamax for prevention).  If there is something that she knows works for her (ie imitrex), I would be happy to send in rx. I don't want to prescribe new medications with discussing the possible risks/side effects.   Currently allergies are flaring for many people, not sure if allergies/sinuses could be contributing.  Things like steroids cannot be prescribed without evaluation.  Be sure that she is treating any allergies (sudafed, antihistamines such as claritin/allegra/zyrtec), getting plenty of rest, and can try OTC migraine meds if she hasn't already done that.  The headache has been going on for 3 weeks (without her arranging to be evaluated), so I guess if she is okay with waiting until next week, that's fine with me.  But be sure she knows that I will be out of town.

## 2015-04-24 NOTE — Telephone Encounter (Signed)
Pt stated she has used muscle relaxer's in the past, said it is in the back of her neck/head and has taken OTC migraine med's. Said that she has had no allergy flare ups at all.

## 2015-04-24 NOTE — Addendum Note (Signed)
Addended by: Joselyn ArrowKNAPP, Anett Ranker on: 04/24/2015 10:15 AM   Modules accepted: Orders

## 2015-04-24 NOTE — Telephone Encounter (Signed)
Pt stated she has had a cluster headache for 3 weeks and wants to know if she can have something sent in for that?

## 2015-10-25 ENCOUNTER — Encounter: Payer: Self-pay | Admitting: Family Medicine

## 2016-01-28 ENCOUNTER — Telehealth: Payer: Self-pay

## 2016-01-28 NOTE — Telephone Encounter (Signed)
LM for pt to CB to confirm transfer of records to Dr. Duanne Guessewey. Trixie Rude/RLB

## 2016-01-29 ENCOUNTER — Telehealth: Payer: Self-pay

## 2016-01-29 NOTE — Telephone Encounter (Signed)
Pt is transferring care to Dr. Chauncy Passyewey's office. Records have been faxed to [phone]. Brenda Haynes/RLB

## 2016-06-21 IMAGING — CT CT MAXILLOFACIAL W/O CM
1 series · 16 of 30 positions shown, 20 images · non-contrast
Comparison: None.

CLINICAL DATA: Facial pain and neck tenderness and tooth pain after
a fall on 06/14/2014.

EXAM:
CT MAXILLOFACIAL WITHOUT CONTRAST
TECHNIQUE: Multidetector CT imaging of the maxillofacial structures was
performed. Multiplanar CT image reconstructions were also generated.
A small metallic BB was placed on the right temple in order to
reliably differentiate right from left.

[Series 5: maxofacial wo std 3.0 h41s · axial · 0.29mm/px · z∈[-188,-38]mm · 16 of 54 slices shown, 20 images]
[im 2/54  brain]
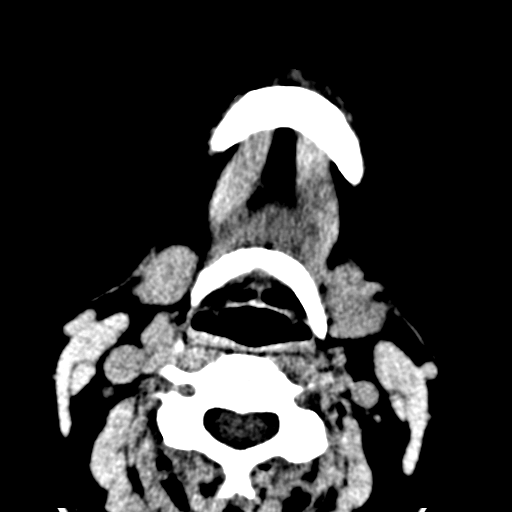
[im 2/54  bone]
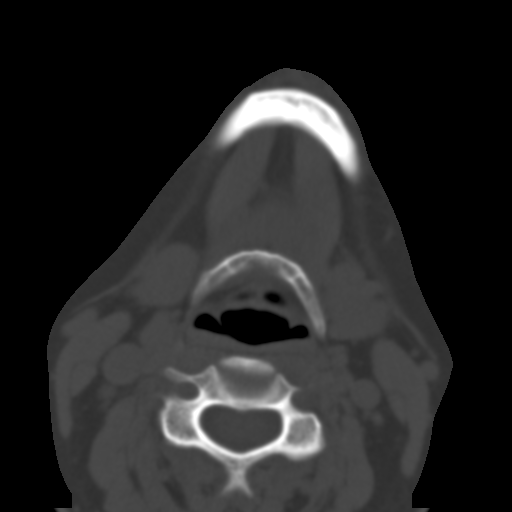
[im 6/54  bone]
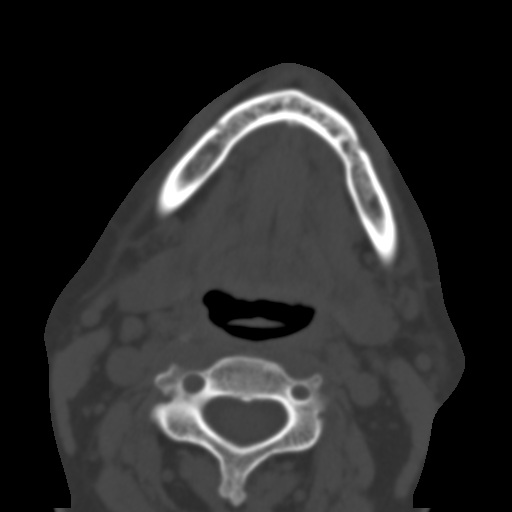
[im 10/54  bone]
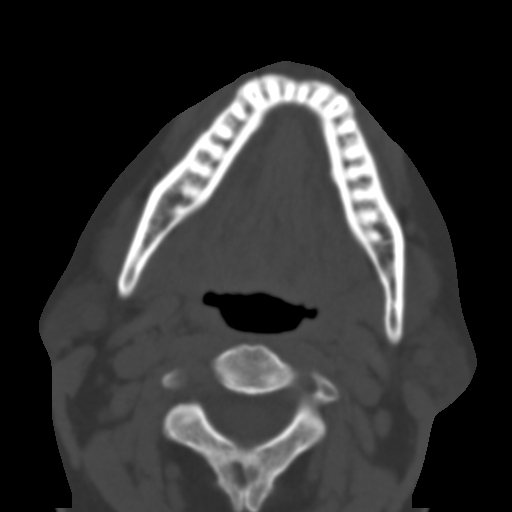
[im 13/54  bone]
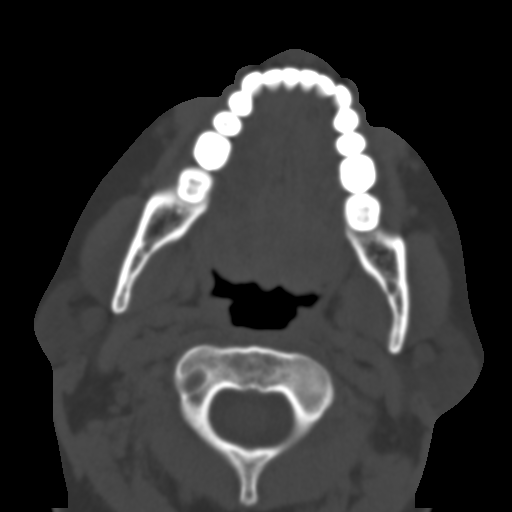
[im 15/54  brain]
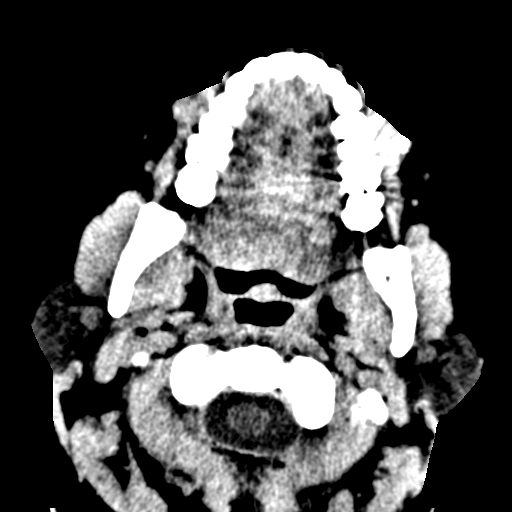
[im 15/54  bone]
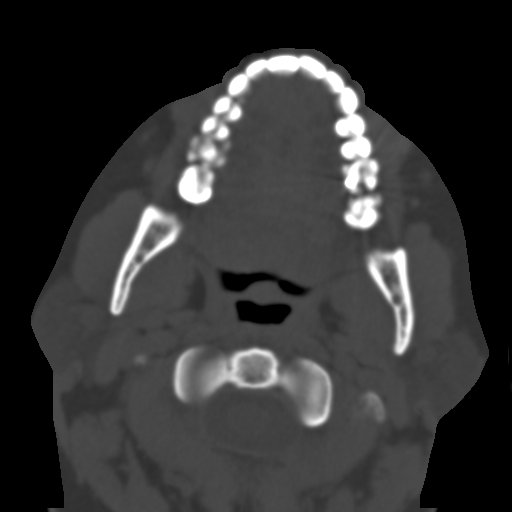
[im 19/54  bone]
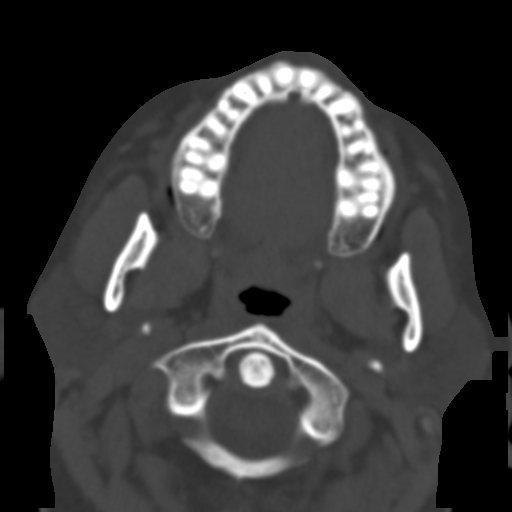
[im 22/54  bone]
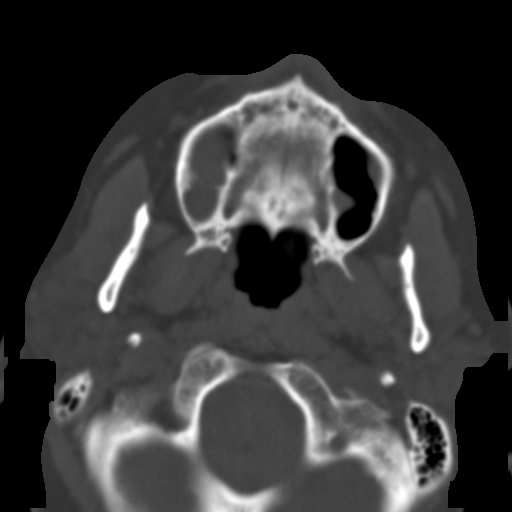
[im 26/54  bone]
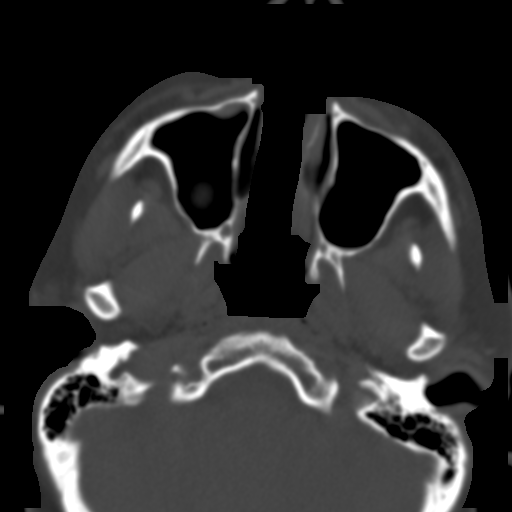
[im 28/54  brain]
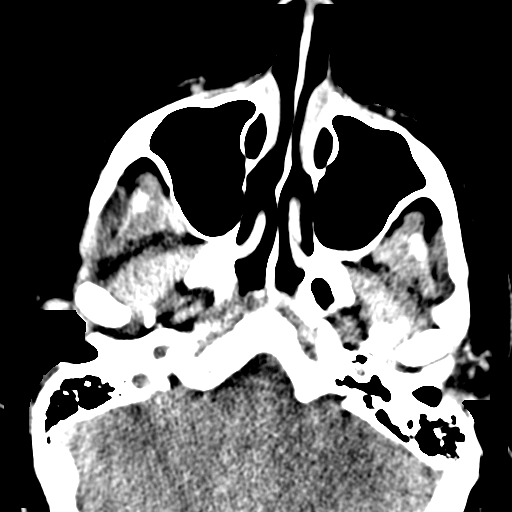
[im 28/54  bone]
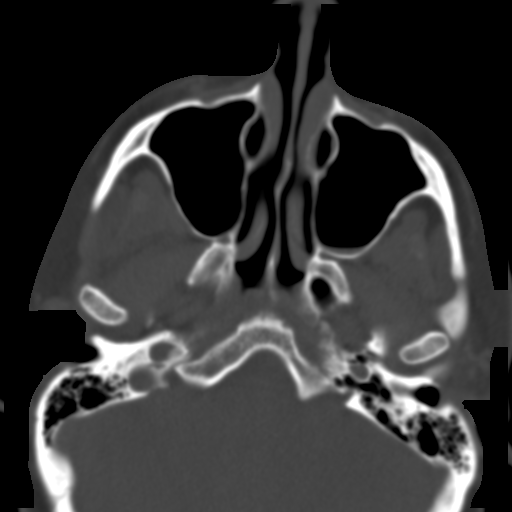
[im 32/54  bone]
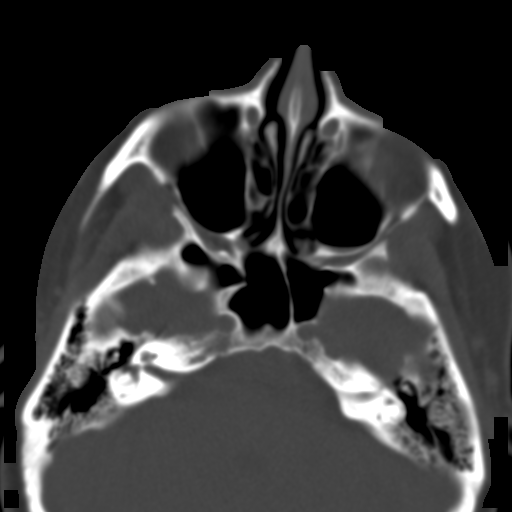
[im 35/54  bone]
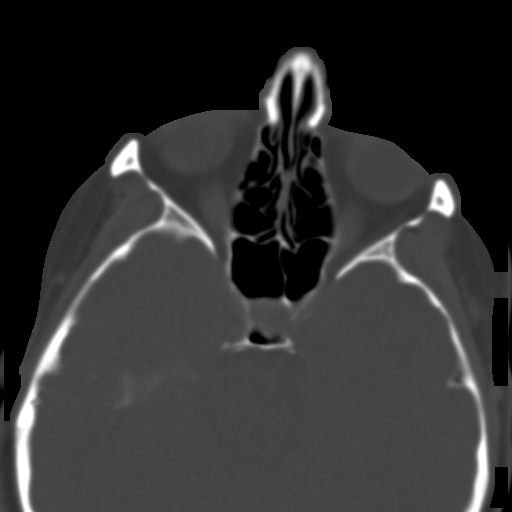
[im 39/54  bone]
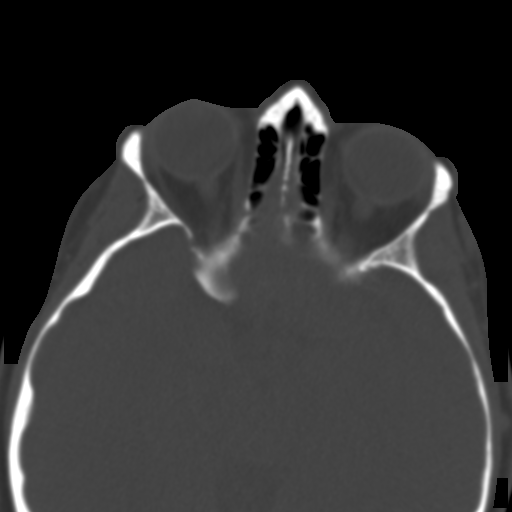
[im 41/54  brain]
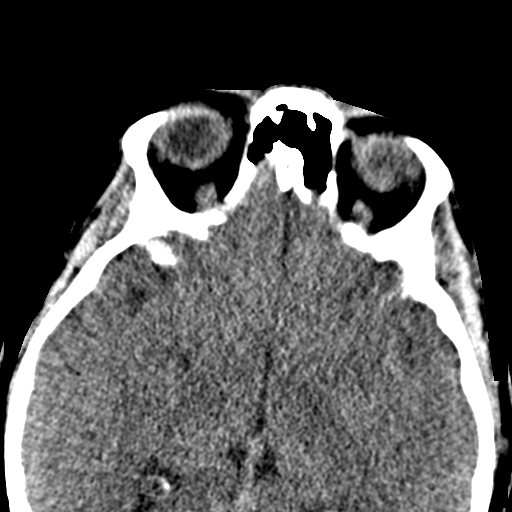
[im 41/54  bone]
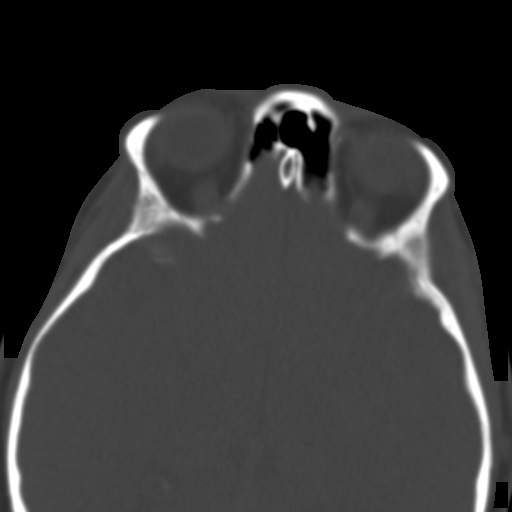
[im 44/54  bone]
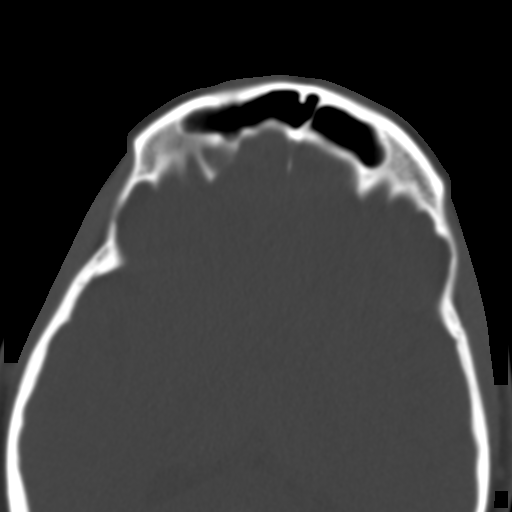
[im 48/54  bone]
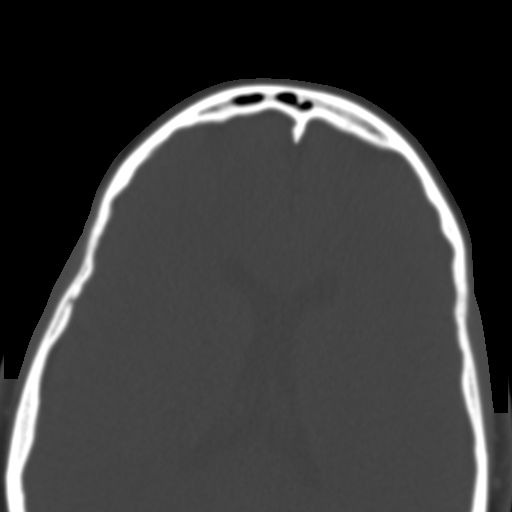
[im 52/54  bone]
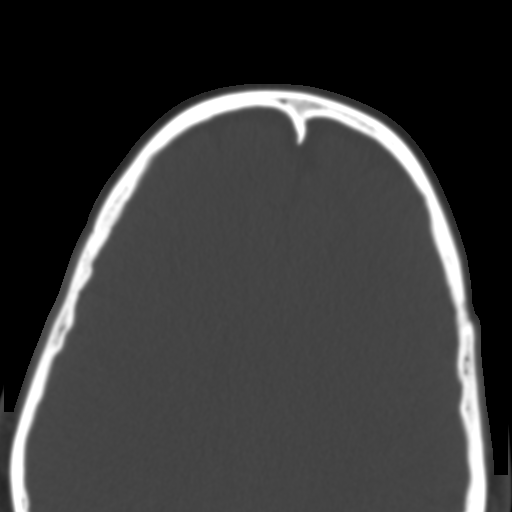

[16 of 30 positions shown; findings below may reference images not displayed]

FINDINGS: There is lucency around the roots of tooth 8 and tooth 9 with
evidence of a tiny fracture of the anterior wall of the socket of
tooth 9 visible on image 41 of series 7. The other teeth appear
normal. There are normal facial bone fractures. Small retention
cysts in the bases of both maxillary sinuses. Ethmoid and sphenoid
sinuses are clear as is the frontal sinus. Visualized intracranial
contents are normal. No adenopathy. Parotid and submandibular glands
are normal.
IMPRESSION: Loosening of the roots of tooth 8 and tooth 9 with a tiny fracture
of the anterior wall of the socket of tooth 9.

## 2016-09-24 ENCOUNTER — Other Ambulatory Visit: Payer: Self-pay | Admitting: Family Medicine

## 2016-09-24 ENCOUNTER — Ambulatory Visit
Admission: RE | Admit: 2016-09-24 | Discharge: 2016-09-24 | Disposition: A | Discharge status: Home / Self Care | Payer: Managed Care, Other (non HMO) | Source: Ambulatory Visit | Attending: Family Medicine | Admitting: Family Medicine

## 2016-09-24 DIAGNOSIS — M79641 Pain in right hand: Secondary | ICD-10-CM

## 2016-09-24 DIAGNOSIS — R609 Edema, unspecified: Secondary | ICD-10-CM

## 2016-10-10 ENCOUNTER — Other Ambulatory Visit: Payer: Self-pay | Admitting: Family Medicine

## 2016-10-10 ENCOUNTER — Ambulatory Visit
Admission: RE | Admit: 2016-10-10 | Discharge: 2016-10-10 | Disposition: A | Discharge status: Home / Self Care | Payer: Managed Care, Other (non HMO) | Source: Ambulatory Visit | Attending: Family Medicine | Admitting: Family Medicine

## 2016-10-10 DIAGNOSIS — R52 Pain, unspecified: Secondary | ICD-10-CM

## 2016-10-10 DIAGNOSIS — R609 Edema, unspecified: Secondary | ICD-10-CM

## 2017-07-03 ENCOUNTER — Other Ambulatory Visit: Payer: Self-pay | Admitting: Family Medicine

## 2017-07-03 ENCOUNTER — Ambulatory Visit
Admission: RE | Admit: 2017-07-03 | Discharge: 2017-07-03 | Disposition: A | Discharge status: Home / Self Care | Payer: Managed Care, Other (non HMO) | Source: Ambulatory Visit | Attending: Family Medicine | Admitting: Family Medicine

## 2017-07-03 DIAGNOSIS — J189 Pneumonia, unspecified organism: Secondary | ICD-10-CM

## 2018-12-14 ENCOUNTER — Other Ambulatory Visit: Payer: Self-pay | Admitting: Obstetrics and Gynecology

## 2018-12-14 DIAGNOSIS — N6310 Unspecified lump in the right breast, unspecified quadrant: Secondary | ICD-10-CM

## 2018-12-21 ENCOUNTER — Other Ambulatory Visit: Payer: Self-pay

## 2018-12-21 ENCOUNTER — Ambulatory Visit
Admission: RE | Admit: 2018-12-21 | Discharge: 2018-12-21 | Disposition: A | Discharge status: Home / Self Care | Payer: Managed Care, Other (non HMO) | Source: Ambulatory Visit | Attending: Obstetrics and Gynecology | Admitting: Obstetrics and Gynecology

## 2018-12-21 DIAGNOSIS — N6310 Unspecified lump in the right breast, unspecified quadrant: Secondary | ICD-10-CM

## 2018-12-24 ENCOUNTER — Other Ambulatory Visit: Payer: Self-pay

## 2018-12-24 DIAGNOSIS — Z20822 Contact with and (suspected) exposure to covid-19: Secondary | ICD-10-CM

## 2018-12-26 LAB — NOVEL CORONAVIRUS, NAA: SARS-CoV-2, NAA: NOT DETECTED

## 2019-09-02 ENCOUNTER — Other Ambulatory Visit: Payer: Self-pay

## 2019-09-02 DIAGNOSIS — Z20822 Contact with and (suspected) exposure to covid-19: Secondary | ICD-10-CM

## 2019-09-03 LAB — NOVEL CORONAVIRUS, NAA: SARS-CoV-2, NAA: NOT DETECTED

## 2019-09-03 LAB — SARS-COV-2, NAA 2 DAY TAT

## 2021-11-22 ENCOUNTER — Ambulatory Visit
Admission: RE | Admit: 2021-11-22 | Discharge: 2021-11-22 | Disposition: A | Discharge status: Home / Self Care | Payer: BC Managed Care – PPO | Source: Ambulatory Visit | Attending: Family Medicine | Admitting: Family Medicine

## 2021-11-22 ENCOUNTER — Other Ambulatory Visit: Payer: Self-pay | Admitting: Family Medicine

## 2021-11-22 DIAGNOSIS — S0093XA Contusion of unspecified part of head, initial encounter: Secondary | ICD-10-CM
